# Patient Record
Sex: Female | Born: 1990 | Race: Black or African American | Hispanic: No | Marital: Single | Smoking: Never smoker
Health system: Southern US, Community
[De-identification: ages and names within clinical notes are randomized; demographics above are authoritative.]

## PROBLEM LIST (undated history)

## (undated) DIAGNOSIS — J069 Acute upper respiratory infection, unspecified: Secondary | ICD-10-CM

## (undated) DIAGNOSIS — L309 Dermatitis, unspecified: Secondary | ICD-10-CM

## (undated) DIAGNOSIS — R87619 Unspecified abnormal cytological findings in specimens from cervix uteri: Secondary | ICD-10-CM

## (undated) DIAGNOSIS — F32A Depression, unspecified: Secondary | ICD-10-CM

## (undated) DIAGNOSIS — B009 Herpesviral infection, unspecified: Secondary | ICD-10-CM

## (undated) DIAGNOSIS — D219 Benign neoplasm of connective and other soft tissue, unspecified: Secondary | ICD-10-CM

## (undated) DIAGNOSIS — J40 Bronchitis, not specified as acute or chronic: Secondary | ICD-10-CM

## (undated) DIAGNOSIS — R7989 Other specified abnormal findings of blood chemistry: Secondary | ICD-10-CM

## (undated) DIAGNOSIS — I82813 Embolism and thrombosis of superficial veins of lower extremities, bilateral: Secondary | ICD-10-CM

## (undated) DIAGNOSIS — Z8619 Personal history of other infectious and parasitic diseases: Secondary | ICD-10-CM

## (undated) DIAGNOSIS — J45909 Unspecified asthma, uncomplicated: Secondary | ICD-10-CM

## (undated) DIAGNOSIS — F329 Major depressive disorder, single episode, unspecified: Secondary | ICD-10-CM

## (undated) DIAGNOSIS — F419 Anxiety disorder, unspecified: Secondary | ICD-10-CM

## (undated) DIAGNOSIS — N390 Urinary tract infection, site not specified: Secondary | ICD-10-CM

## (undated) HISTORY — DX: Dermatitis, unspecified: L30.9

## (undated) HISTORY — DX: Unspecified asthma, uncomplicated: J45.909

## (undated) HISTORY — DX: Other specified abnormal findings of blood chemistry: R79.89

## (undated) HISTORY — DX: Herpesviral infection, unspecified: B00.9

## (undated) HISTORY — DX: Benign neoplasm of connective and other soft tissue, unspecified: D21.9

## (undated) HISTORY — DX: Acute upper respiratory infection, unspecified: J06.9

## (undated) HISTORY — DX: Depression, unspecified: F32.A

## (undated) HISTORY — DX: Embolism and thrombosis of superficial veins of lower extremities, bilateral: I82.813

## (undated) HISTORY — DX: Unspecified abnormal cytological findings in specimens from cervix uteri: R87.619

## (undated) HISTORY — DX: Major depressive disorder, single episode, unspecified: F32.9

## (undated) HISTORY — DX: Personal history of other infectious and parasitic diseases: Z86.19

## (undated) HISTORY — DX: Anxiety disorder, unspecified: F41.9

---
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
## 2009-04-04 ENCOUNTER — Emergency Department (HOSPITAL_COMMUNITY): Admission: EM | Admit: 2009-04-04 | Discharge: 2009-04-04 | Payer: Self-pay | Admitting: Emergency Medicine

## 2009-11-28 ENCOUNTER — Emergency Department (HOSPITAL_COMMUNITY): Admission: EM | Admit: 2009-11-28 | Discharge: 2009-11-28 | Payer: Self-pay | Admitting: Emergency Medicine

## 2010-01-14 ENCOUNTER — Encounter: Admission: RE | Admit: 2010-01-14 | Discharge: 2010-01-14 | Payer: Self-pay | Admitting: Family Medicine

## 2010-08-28 LAB — URINALYSIS, ROUTINE W REFLEX MICROSCOPIC
Nitrite: NEGATIVE
Protein, ur: NEGATIVE mg/dL
Urobilinogen, UA: 1 mg/dL (ref 0.0–1.0)
pH: 7 (ref 5.0–8.0)

## 2010-08-28 LAB — COMPREHENSIVE METABOLIC PANEL
AST: 17 U/L (ref 0–37)
CO2: 26 mEq/L (ref 19–32)
Calcium: 8.9 mg/dL (ref 8.4–10.5)
GFR calc Af Amer: >60 mL/min (ref >60)
GFR calc non Af Amer: >60 mL/min (ref >60)
Glucose, Bld: 87 mg/dL (ref 70–99)
Potassium: 3.7 mEq/L (ref 3.5–5.1)
Sodium: 138 mEq/L (ref 135–145)
Total Bilirubin: 0.6 mg/dL (ref 0.3–1.2)

## 2010-08-28 LAB — CBC
Hemoglobin: 13.3 g/dL (ref 12.0–15.0)
MCHC: 34.3 g/dL (ref 30.0–36.0)
RDW: 15.2 % (ref 11.5–15.5)

## 2010-08-28 LAB — DIFFERENTIAL
Basophils Absolute: 0 10*3/uL (ref 0.0–0.1)
Eosinophils Absolute: 0 10*3/uL (ref 0.0–0.7)
Eosinophils Relative: 0 % (ref 0–5)
Lymphocytes Relative: 32 % (ref 12–46)
Lymphs Abs: 1.9 10*3/uL (ref 0.7–4.0)
Neutrophils Relative %: 60 % (ref 43–77)

## 2010-08-28 LAB — LIPASE, BLOOD: Lipase: 30 U/L (ref 11–59)

## 2010-09-15 LAB — URINE MICROSCOPIC-ADD ON

## 2010-09-15 LAB — URINALYSIS, ROUTINE W REFLEX MICROSCOPIC
Protein, ur: NEGATIVE mg/dL
Specific Gravity, Urine: 1.02 (ref 1.005–1.030)
Urobilinogen, UA: 1 mg/dL (ref 0.0–1.0)

## 2010-09-15 LAB — POCT I-STAT, CHEM 8
Chloride: 103 mEq/L (ref 96–112)
Creatinine, Ser: 0.9 mg/dL (ref 0.4–1.2)
Hemoglobin: 13.3 g/dL (ref 12.0–15.0)
TCO2: 25 mmol/L (ref 0–100)

## 2010-09-15 LAB — PREGNANCY, URINE: Preg Test, Ur: NEGATIVE

## 2012-07-13 DIAGNOSIS — Z8619 Personal history of other infectious and parasitic diseases: Secondary | ICD-10-CM

## 2012-07-13 HISTORY — DX: Personal history of other infectious and parasitic diseases: Z86.19

## 2012-09-07 ENCOUNTER — Encounter (HOSPITAL_COMMUNITY): Payer: Self-pay | Admitting: Emergency Medicine

## 2012-09-07 ENCOUNTER — Emergency Department (HOSPITAL_COMMUNITY)
Admission: EM | Admit: 2012-09-07 | Discharge: 2012-09-07 | Disposition: A | Discharge status: Home / Self Care | Payer: Self-pay | Attending: Emergency Medicine | Admitting: Emergency Medicine

## 2012-09-07 DIAGNOSIS — I8390 Asymptomatic varicose veins of unspecified lower extremity: Secondary | ICD-10-CM | POA: Insufficient documentation

## 2012-09-07 DIAGNOSIS — M79605 Pain in left leg: Secondary | ICD-10-CM

## 2012-09-07 DIAGNOSIS — M79604 Pain in right leg: Secondary | ICD-10-CM

## 2012-09-07 DIAGNOSIS — M79609 Pain in unspecified limb: Secondary | ICD-10-CM | POA: Insufficient documentation

## 2012-09-07 DIAGNOSIS — M722 Plantar fascial fibromatosis: Secondary | ICD-10-CM | POA: Insufficient documentation

## 2012-09-07 DIAGNOSIS — M7989 Other specified soft tissue disorders: Secondary | ICD-10-CM | POA: Insufficient documentation

## 2012-09-07 MED ORDER — TRAMADOL HCL 50 MG PO TABS
50.0000 mg | ORAL_TABLET | Freq: Once | ORAL | Status: AC
  Administered 2012-09-07: 50 mg via ORAL
  Filled 2012-09-07: qty 1

## 2012-09-07 MED ORDER — DICLOFENAC SODIUM 75 MG PO TBEC: 75.0000 mg | DELAYED_RELEASE_TABLET | Freq: Two times a day (BID) | ORAL | Status: DC

## 2012-09-07 NOTE — ED Notes (Signed)
Pt complains of "both legs hurting me for a year and a half" Pt reports that the pain is worse when standing.

## 2012-09-07 NOTE — ED Provider Notes (Signed)
History    This chart was scribed for non-physician practitioner working with Rolan Bucco, MD by Smitty Pluck, ED scribe. This patient was seen in room WTR9/WTR9 and the patient's care was started at 4:45 PM.   CSN: [id number]  Arrival date & time 09/07/12  1626     Chief Complaint  Patient presents with  . Leg Pain    The history is provided by the patient. No language interpreter was used.   Shelby Jackson is a 22 y.o. female who presents to the Emergency Department complaining of intermittent, bilateral leg pain onset 1.5 years ago that worsened yesterday. She describes the pain as aching and tightness. She states that when she stands she has bilateral swelling and pain in legs. She reports that she normally stands at work for long periods of time. She mentions that the pain normally subsides after resting but the pain has remained constant since yesterday. Movement of feet aggravates the pain. She states that she purchased new Dr Jabier Mutton shoes without relief of pain. Denies taking medication PTA.  Pt denies injury to legs or feet, fall, fever, chills, nausea, vomiting, diarrhea, weakness, cough, SOB and any other pain.    History reviewed. No pertinent past medical history.  History reviewed. No pertinent past surgical history.  No family history on file.  History  Substance Use Topics  . Smoking status: Never Smoker   . Smokeless tobacco: Not on file  . Alcohol Use: No    OB History   Grav Para Term Preterm Abortions TAB SAB Ect Mult Living                  Review of Systems  Constitutional: Negative for fever and chills.  Respiratory: Negative for shortness of breath.   Gastrointestinal: Negative for nausea and vomiting.  Musculoskeletal: Positive for arthralgias.  Neurological: Negative for weakness.  All other systems reviewed and are negative.    Allergies  Latex  Home Medications   Current Outpatient Rx  Name  Route  Sig  Dispense  Refill  .  norethindrone-ethinyl estradiol (TRIPHASIL,CYCLAFEM,ALYACEN) 0.5/0.75/1-35 MG-MCG tablet   Oral   Take 1 tablet by mouth daily.           BP 128/71  Pulse 79  Temp(Src) 98.5 F (36.9 C) (Oral)  SpO2 100%  LMP 08/25/2012  Physical Exam  Nursing note and vitals reviewed. Constitutional: She is oriented to person, place, and time. She appears well-developed and well-nourished. No distress.  HENT:  Head: Normocephalic and atraumatic.  Eyes: EOM are normal.  Neck: Neck supple. No tracheal deviation present.  Cardiovascular: Normal rate.   Pulmonary/Chest: Effort normal. No respiratory distress.  Musculoskeletal: Normal range of motion.       Right lower leg: Normal.       Left lower leg: Normal.       Left foot: She exhibits tenderness (over the planatar fascia). She exhibits normal range of motion, no bony tenderness, no swelling, normal capillary refill, no crepitus, no deformity and no laceration.  Neurological: She is alert and oriented to person, place, and time.  Skin: Skin is warm and dry.  Psychiatric: She has a normal mood and affect. Her behavior is normal.    ED Course  Procedures (including critical care time) DIAGNOSTIC STUDIES: Oxygen Saturation is 100% on room air, normal by my interpretation.    COORDINATION OF CARE: 5:00 PM Discussed ED treatment with pt and pt agrees to pain medication. Pt instructed to wear compression stockings.  Pt will get referral to orthopedist.     Labs Reviewed - No data to display No results found.   1. Varicose veins   2. Plantar fasciitis of left foot   3. Leg pain, bilateral       MDM  Pains have been for 1.5 years. Legs ache after standing up for 6 hours despite new shoes. Will give Ortho referral.  Pt has been advised of the symptoms that warrant their return to the ED. Patient has voiced understanding and has agreed to follow-up with the PCP or specialist.  I personally performed the services described in this  documentation, which was scribed in my presence. The recorded information has been reviewed and is accurate.   Dorthula Matas, PA-C 09/07/12 1707

## 2012-09-07 NOTE — ED Provider Notes (Signed)
Medical screening examination/treatment/procedure(s) were performed by non-physician practitioner and as supervising physician I was immediately available for consultation/collaboration.   Enes Wegener, MD 09/07/12 2241 

## 2012-10-25 ENCOUNTER — Encounter (HOSPITAL_COMMUNITY): Payer: Self-pay | Admitting: Emergency Medicine

## 2012-10-25 ENCOUNTER — Emergency Department (HOSPITAL_COMMUNITY)
Admission: EM | Admit: 2012-10-25 | Discharge: 2012-10-25 | Disposition: A | Discharge status: Home / Self Care | Payer: BC Managed Care – PPO | Attending: Emergency Medicine | Admitting: Emergency Medicine

## 2012-10-25 ENCOUNTER — Emergency Department (HOSPITAL_COMMUNITY): Payer: BC Managed Care – PPO

## 2012-10-25 DIAGNOSIS — R109 Unspecified abdominal pain: Secondary | ICD-10-CM | POA: Insufficient documentation

## 2012-10-25 DIAGNOSIS — Z87440 Personal history of urinary (tract) infections: Secondary | ICD-10-CM | POA: Insufficient documentation

## 2012-10-25 DIAGNOSIS — R319 Hematuria, unspecified: Secondary | ICD-10-CM | POA: Insufficient documentation

## 2012-10-25 DIAGNOSIS — R3915 Urgency of urination: Secondary | ICD-10-CM | POA: Insufficient documentation

## 2012-10-25 DIAGNOSIS — N3090 Cystitis, unspecified without hematuria: Secondary | ICD-10-CM | POA: Diagnosis present

## 2012-10-25 DIAGNOSIS — M549 Dorsalgia, unspecified: Secondary | ICD-10-CM | POA: Insufficient documentation

## 2012-10-25 DIAGNOSIS — N949 Unspecified condition associated with female genital organs and menstrual cycle: Secondary | ICD-10-CM | POA: Insufficient documentation

## 2012-10-25 DIAGNOSIS — N390 Urinary tract infection, site not specified: Secondary | ICD-10-CM

## 2012-10-25 DIAGNOSIS — R300 Dysuria: Secondary | ICD-10-CM | POA: Insufficient documentation

## 2012-10-25 DIAGNOSIS — Z3202 Encounter for pregnancy test, result negative: Secondary | ICD-10-CM | POA: Insufficient documentation

## 2012-10-25 DIAGNOSIS — Z91040 Latex allergy status: Secondary | ICD-10-CM | POA: Insufficient documentation

## 2012-10-25 HISTORY — DX: Urinary tract infection, site not specified: N39.0

## 2012-10-25 LAB — URINALYSIS, ROUTINE W REFLEX MICROSCOPIC
Glucose, UA: NEGATIVE mg/dL
Specific Gravity, Urine: 1.027 (ref 1.005–1.030)
Urobilinogen, UA: 1 mg/dL (ref 0.0–1.0)

## 2012-10-25 LAB — CBC WITH DIFFERENTIAL/PLATELET
Basophils Relative: 0 % (ref 0–1)
Eosinophils Absolute: 0 10*3/uL (ref 0.0–0.7)
Eosinophils Relative: 0 % (ref 0–5)
Hemoglobin: 14 g/dL (ref 12.0–15.0)
Lymphs Abs: 1.3 10*3/uL (ref 0.7–4.0)
MCH: 32.4 pg (ref 26.0–34.0)
MCHC: 35.9 g/dL (ref 30.0–36.0)
MCV: 90.3 fL (ref 78.0–100.0)
Monocytes Relative: 7 % (ref 3–12)
Platelets: 132 10*3/uL — ABNORMAL LOW (ref 150–400)
RBC: 4.32 MIL/uL (ref 3.87–5.11)

## 2012-10-25 LAB — URINE MICROSCOPIC-ADD ON

## 2012-10-25 LAB — BASIC METABOLIC PANEL
BUN: 10 mg/dL (ref 6–23)
CO2: 22 mEq/L (ref 19–32)
Calcium: 8.8 mg/dL (ref 8.4–10.5)
Creatinine, Ser: 1.1 mg/dL (ref 0.50–1.10)
GFR calc Af Amer: 82 mL/min — ABNORMAL LOW (ref >90)

## 2012-10-25 LAB — PREGNANCY, URINE: Preg Test, Ur: NEGATIVE

## 2012-10-25 LAB — POCT PREGNANCY, URINE: Preg Test, Ur: NEGATIVE

## 2012-10-25 MED ORDER — TRAMADOL HCL 50 MG PO TABS
50.0000 mg | ORAL_TABLET | Freq: Once | ORAL | Status: AC
  Administered 2012-10-25: 50 mg via ORAL
  Filled 2012-10-25: qty 1

## 2012-10-25 MED ORDER — MORPHINE SULFATE 4 MG/ML IJ SOLN
4.0000 mg | Freq: Once | INTRAMUSCULAR | Status: AC
  Administered 2012-10-25: 4 mg via INTRAVENOUS
  Filled 2012-10-25: qty 1

## 2012-10-25 MED ORDER — CEPHALEXIN 500 MG PO CAPS: 500.0000 mg | ORAL_CAPSULE | Freq: Three times a day (TID) | ORAL | Status: DC

## 2012-10-25 MED ORDER — ONDANSETRON 8 MG PO TBDP
8.0000 mg | ORAL_TABLET | Freq: Once | ORAL | Status: AC
  Administered 2012-10-25: 8 mg via ORAL
  Filled 2012-10-25: qty 1

## 2012-10-25 MED ORDER — HYDROCODONE-ACETAMINOPHEN 5-325 MG PO TABS
1.0000 | ORAL_TABLET | Freq: Once | ORAL | Status: AC
  Administered 2012-10-25: 1 via ORAL
  Filled 2012-10-25: qty 1

## 2012-10-25 MED ORDER — LIDOCAINE HCL 1 % IJ SOLN
INTRAMUSCULAR | Status: AC
  Administered 2012-10-25: 20 mL
  Filled 2012-10-25: qty 20

## 2012-10-25 MED ORDER — SODIUM CHLORIDE 0.9 % IV SOLN
Freq: Once | INTRAVENOUS | Status: AC
  Administered 2012-10-25: 22:00:00 via INTRAVENOUS

## 2012-10-25 MED ORDER — PROMETHAZINE HCL 25 MG PO TABS: 25.0000 mg | ORAL_TABLET | Freq: Four times a day (QID) | ORAL | Status: DC | PRN

## 2012-10-25 MED ORDER — HYDROCODONE-ACETAMINOPHEN 5-325 MG PO TABS: 1.0000 | ORAL_TABLET | ORAL | Status: DC | PRN

## 2012-10-25 MED ORDER — CEFTRIAXONE SODIUM 1 G IJ SOLR
1.0000 g | Freq: Once | INTRAMUSCULAR | Status: AC
  Administered 2012-10-25: 1 g via INTRAMUSCULAR
  Filled 2012-10-25: qty 10

## 2012-10-25 MED ORDER — PROMETHAZINE HCL 25 MG/ML IJ SOLN
12.5000 mg | Freq: Once | INTRAMUSCULAR | Status: AC
  Administered 2012-10-25: 12.5 mg via INTRAVENOUS
  Filled 2012-10-25: qty 1

## 2012-10-25 NOTE — ED Notes (Signed)
Pt states that she has pain with urination and burning when voiding for the past few days now, has been taking azo for this with minimal relief and now has rt side abd pain.

## 2012-10-25 NOTE — ED Notes (Signed)
Patient states that pain medication did not help.

## 2012-10-25 NOTE — ED Notes (Signed)
PA at the bedside.

## 2012-10-25 NOTE — ED Notes (Signed)
Patient ambulated to bathroom gait steady

## 2012-10-25 NOTE — ED Provider Notes (Signed)
Medical screening examination/treatment/procedure(s) were performed by non-physician practitioner and as supervising physician I was immediately available for consultation/collaboration.  Geoffery Lyons, MD 10/25/12 2322

## 2012-10-25 NOTE — ED Provider Notes (Signed)
History    CSN: [id number] Arrival date & time 10/25/12  1126  First MD Initiated Contact with Patient 10/25/12 1137     Chief Complaint  Patient presents with  . Urinary Frequency   HPI Comments: 22 year old female with a two-day history of urinary frequency and dysuria.  Presenting with worsening right lower quadrant and suprapubic pain radiating to her right lower flank.  She has been treating herself with his over-the-counter AZO for presumed urinary tract infection. She denies any nausea, vomiting (non today, 1 episode of regurgitation 3 days ago prior to onset of symptoms), fevers, chills.  Reports having normal bowel movements.  LMP 2 weeks ago and was normal.  On OCPs takes daily.  Unprotected intercourse with 1 partner. No hx of STI.  Checked for STI 2 weeks ago and was negative.  No hx of Kidney Stones.    The history is provided by the patient.    Past Medical History  Diagnosis Date  . UTI (lower urinary tract infection)     No past surgical history on file.  No family history on file.  History  Substance Use Topics  . Smoking status: Never Smoker   . Smokeless tobacco: Not on file  . Alcohol Use: No    OB History   Grav Para Term Preterm Abortions TAB SAB Ect Mult Living                  Review of Systems  Constitutional: Negative for chills, diaphoresis, activity change, appetite change and fatigue.  HENT: Negative for congestion, facial swelling, neck pain, neck stiffness and voice change.   Eyes: Negative for discharge.  Respiratory: Negative for choking, chest tightness, shortness of breath and wheezing.   Cardiovascular: Negative for chest pain and palpitations.  Gastrointestinal: Positive for abdominal pain (suprapubic radiating to R sided pain but not to CVA). Negative for nausea, vomiting, diarrhea, constipation, blood in stool and abdominal distention.  Endocrine: Negative.   Genitourinary: Positive for dysuria, urgency, frequency, hematuria (unsure  given on AZO and urine is red/pink), flank pain, decreased urine volume, difficulty urinating and pelvic pain. Negative for vaginal bleeding, vaginal discharge, vaginal pain, menstrual problem and dyspareunia.  Musculoskeletal: Positive for back pain (r sided flank pain not at level of CVA).  Skin: Negative.   Allergic/Immunologic: Negative.   Neurological: Negative for dizziness, syncope and headaches.  Hematological: Negative.   Psychiatric/Behavioral: Negative.     Allergies  Caffeine and Latex  Home Medications   Current Outpatient Rx  Name  Route  Sig  Dispense  Refill  . diclofenac (VOLTAREN) 75 MG EC tablet   Oral   Take 1 tablet (75 mg total) by mouth 2 (two) times daily.   20 tablet   0   . norethindrone-ethinyl estradiol (TRIPHASIL,CYCLAFEM,ALYACEN) 0.5/0.75/1-35 MG-MCG tablet   Oral   Take 1 tablet by mouth daily.         . cephALEXin (KEFLEX) 500 MG capsule   Oral   Take 1 capsule (500 mg total) by mouth 3 (three) times daily.   21 capsule   0     BP 130/86  Pulse 69  Temp(Src) 98.3 F (36.8 C) (Oral)  Resp 16  SpO2 98%  LMP 10/11/2012  Physical Exam  Nursing note and vitals reviewed. Constitutional: She appears well-developed and well-nourished. No distress.  HENT:  Head: Normocephalic and atraumatic.  Eyes: Conjunctivae are normal. Right eye exhibits no discharge. Left eye exhibits no discharge. No scleral icterus.  Neck:  No JVD present. No tracheal deviation present.  Cardiovascular: Normal rate, regular rhythm, normal heart sounds and intact distal pulses.  Exam reveals no gallop and no friction rub.   No murmur heard. Pulmonary/Chest: Effort normal and breath sounds normal. No respiratory distress. She has no wheezes. She has no rales.  Abdominal: Soft. She exhibits distension. She exhibits no mass. There is tenderness (diffusely/nonfocal RLQ/suprapubic, not over mcburney's, negative murphy's). There is rebound and guarding.  Musculoskeletal:  Normal range of motion. She exhibits no edema.       Arms: Neurological: She is alert. She exhibits normal muscle tone.  Skin: Skin is warm and dry. No rash noted. She is not diaphoretic. No erythema. No pallor.  Psychiatric: She has a normal mood and affect. Her behavior is normal. Judgment and thought content normal.    ED Course  Procedures (including critical care time)  Labs Reviewed  URINALYSIS, ROUTINE W REFLEX MICROSCOPIC - Abnormal; Notable for the following:    Color, Urine ORANGE (*)    APPearance HAZY (*)    Hgb urine dipstick LARGE (*)    Bilirubin Urine SMALL (*)    Protein, ur 30 (*)    Nitrite POSITIVE (*)    Leukocytes, UA SMALL (*)    All other components within normal limits  BASIC METABOLIC PANEL - Abnormal; Notable for the following:    GFR calc non Af Amer 71 (*)    GFR calc Af Amer 82 (*)    All other components within normal limits  URINE MICROSCOPIC-ADD ON - Abnormal; Notable for the following:    Squamous Epithelial / LPF FEW (*)    Bacteria, UA FEW (*)    All other components within normal limits  URINE CULTURE  PREGNANCY, URINE  POCT PREGNANCY, URINE   US Renal  10/25/2012   *RADIOLOGY REPORT*  Clinical Data: Urinary frequency.  Evaluate for hydronephrosis.  RENAL/URINARY TRACT ULTRASOUND COMPLETE  Comparison:  Plain films same date.  Ultrasound 11/28/2009  Findings:  Right Kidney:  10.5 cm.  Minimal pelvicaliectasis, without overt hydronephrosis.  Left Kidney:  9.2 cm. No hydronephrosis.  Bladder:  Partially collapsed.  Ureteric jets not identified.  IMPRESSION: Minimal right-sided pelvicaliectasis, without overt hydronephrosis.   Original Report Authenticated By: Jeronimo Greaves, M.D.   Dg Abd Acute W/chest  10/25/2012   *RADIOLOGY REPORT*  Clinical Data: Abdominal pain  ACUTE ABDOMEN SERIES (ABDOMEN 2 VIEW & CHEST 1 VIEW)  Comparison:  04/04/2009 chest x-ray  Findings:  There is no evidence of dilated bowel loops or free intraperitoneal air.  No  radiopaque calculi or other significant radiographic abnormality is seen. Heart size and mediastinal contours are within normal limits.  Both lungs are clear.  IMPRESSION: Negative abdominal radiographs.  No acute cardiopulmonary disease.   Original Report Authenticated By: Judie Petit. Shick, M.D.     1. Cystitis       MDM  2 day history of urinary symptoms.  No systemic s/sx but given pain radiating to R side will obtain acute abdominal series & UA  UA with UTI.  Given flank pain will order [place] to eval for hydro and consider infected stone vs pyleo?  Tx with Rocephin IM X 1 and plan for Keflex as OP.    Will need OP follow up and return precautions reviewed.  Return precautions given regarding potential occult appendicitis vs pyelonephrosis.  Andrena Mews, DO 10/25/12 1510

## 2012-10-25 NOTE — ED Notes (Signed)
Pt c/o pain with urination onset today, seen earlier for same, received inj of antbx and script. Pt states pain now worse with +n/v

## 2012-10-25 NOTE — ED Provider Notes (Signed)
History     CSN: [id number]  Arrival date & time 10/25/12  1906   First MD Initiated Contact with Patient 10/25/12 1919      Chief Complaint  Patient presents with  . Dysuria    (Consider location/radiation/quality/duration/timing/severity/associated sxs/prior treatment) Patient is a 22 y.o. female presenting with dysuria. The history is provided by the patient. No language interpreter was used.  Dysuria  This is a new problem. Associated symptoms include nausea and flank pain. Associated symptoms comments: Seen earlier today and diagnosed with UTI. She returns with increased right flank pain and nausea. Symptoms started today with dysuria. No other symptoms. No fever. .    Past Medical History  Diagnosis Date  . UTI (lower urinary tract infection)     History reviewed. No pertinent past surgical history.  No family history on file.  History  Substance Use Topics  . Smoking status: Never Smoker   . Smokeless tobacco: Not on file  . Alcohol Use: No    OB History   Grav Para Term Preterm Abortions TAB SAB Ect Mult Living                  Review of Systems  Constitutional: Negative for fever.  Respiratory: Negative for shortness of breath.   Gastrointestinal: Positive for nausea.  Genitourinary: Positive for dysuria and flank pain.    Allergies  Caffeine and Latex  Home Medications   Current Outpatient Rx  Name  Route  Sig  Dispense  Refill  . cephALEXin (KEFLEX) 500 MG capsule   Oral   Take 1 capsule (500 mg total) by mouth 3 (three) times daily.   21 capsule   0   . diclofenac (VOLTAREN) 75 MG EC tablet   Oral   Take 1 tablet (75 mg total) by mouth 2 (two) times daily.   20 tablet   0   . norethindrone-ethinyl estradiol (TRIPHASIL,CYCLAFEM,ALYACEN) 0.5/0.75/1-35 MG-MCG tablet   Oral   Take 1 tablet by mouth daily.           BP 143/98  Pulse 80  Temp(Src) 98.3 F (36.8 C) (Oral)  Resp 18  Ht 5\' 1"  (1.549 m)  Wt 185 lb (83.915 kg)  BMI  34.97 kg/m2  SpO2 98%  LMP 10/11/2012  Physical Exam  Constitutional: She is oriented to person, place, and time. She appears well-developed and well-nourished.  HENT:  Head: Normocephalic.  Neck: Normal range of motion. Neck supple.  Cardiovascular: Normal rate and regular rhythm.   Pulmonary/Chest: Effort normal and breath sounds normal.  Abdominal: Soft. Bowel sounds are normal. There is no rebound and no guarding.  Right sided abdominal tenderness without rebound or guarding. No RLQ tenderness.   Musculoskeletal: Normal range of motion.  Neurological: She is alert and oriented to person, place, and time.  Skin: Skin is warm and dry. No rash noted.  Psychiatric: She has a normal mood and affect.    ED Course  Procedures (including critical care time)  Labs Reviewed  CBC WITH DIFFERENTIAL - Abnormal; Notable for the following:    Platelets 132 (*)    Neutrophils Relative % 78 (*)    All other components within normal limits   US Renal  10/25/2012   *RADIOLOGY REPORT*  Clinical Data: Urinary frequency.  Evaluate for hydronephrosis.  RENAL/URINARY TRACT ULTRASOUND COMPLETE  Comparison:  Plain films same date.  Ultrasound 11/28/2009  Findings:  Right Kidney:  10.5 cm.  Minimal pelvicaliectasis, without overt hydronephrosis.  Left Kidney:  9.2 cm. No hydronephrosis.  Bladder:  Partially collapsed.  Ureteric jets not identified.  IMPRESSION: Minimal right-sided pelvicaliectasis, without overt hydronephrosis.   Original Report Authenticated By: Jeronimo Greaves, M.D.   Dg Abd Acute W/chest  10/25/2012   *RADIOLOGY REPORT*  Clinical Data: Abdominal pain  ACUTE ABDOMEN SERIES (ABDOMEN 2 VIEW & CHEST 1 VIEW)  Comparison:  04/04/2009 chest x-ray  Findings:  There is no evidence of dilated bowel loops or free intraperitoneal air.  No radiopaque calculi or other significant radiographic abnormality is seen. Heart size and mediastinal contours are within normal limits.  Both lungs are clear.   IMPRESSION: Negative abdominal radiographs.  No acute cardiopulmonary disease.   Original Report Authenticated By: Judie Petit. Miles Costain, M.D.     No diagnosis found. 1. UTI    MDM  Pain and nausea controlled. Patient feeling much better. CBC checked (was not checked earlier) and is normal. Stable for discharge.         Arnoldo Hooker, PA-C 10/25/12 2308

## 2012-10-25 NOTE — ED Notes (Signed)
Went to beside and found patient laying over trash can. Patient states that she is very nauseated. Medicated patient for nausea. Informed patient that we would allow time to allow medication to relieve nausea before administering pain medication and to notify nurse if she began vomiting.

## 2012-10-26 LAB — URINE CULTURE: Colony Count: 75000

## 2012-10-29 NOTE — ED Provider Notes (Signed)
I saw and evaluated the patient, reviewed the resident's note and I agree with the findings and plan.  Symptoms controlled. Uti. No signs of hydro or complicating infection  Lyanne Co, MD 10/29/12 [phone]

## 2012-12-06 ENCOUNTER — Emergency Department (HOSPITAL_COMMUNITY)
Admission: EM | Admit: 2012-12-06 | Discharge: 2012-12-06 | Disposition: A | Discharge status: Home / Self Care | Payer: BC Managed Care – PPO | Attending: Emergency Medicine | Admitting: Emergency Medicine

## 2012-12-06 ENCOUNTER — Encounter (HOSPITAL_COMMUNITY): Payer: Self-pay | Admitting: Emergency Medicine

## 2012-12-06 DIAGNOSIS — Z711 Person with feared health complaint in whom no diagnosis is made: Secondary | ICD-10-CM

## 2012-12-06 DIAGNOSIS — R300 Dysuria: Secondary | ICD-10-CM | POA: Insufficient documentation

## 2012-12-06 DIAGNOSIS — Z3202 Encounter for pregnancy test, result negative: Secondary | ICD-10-CM | POA: Insufficient documentation

## 2012-12-06 DIAGNOSIS — N898 Other specified noninflammatory disorders of vagina: Secondary | ICD-10-CM | POA: Insufficient documentation

## 2012-12-06 DIAGNOSIS — Z113 Encounter for screening for infections with a predominantly sexual mode of transmission: Secondary | ICD-10-CM | POA: Insufficient documentation

## 2012-12-06 DIAGNOSIS — Z87440 Personal history of urinary (tract) infections: Secondary | ICD-10-CM | POA: Insufficient documentation

## 2012-12-06 DIAGNOSIS — R319 Hematuria, unspecified: Secondary | ICD-10-CM

## 2012-12-06 DIAGNOSIS — R109 Unspecified abdominal pain: Secondary | ICD-10-CM

## 2012-12-06 LAB — WET PREP, GENITAL
Trich, Wet Prep: NONE SEEN
Yeast Wet Prep HPF POC: NONE SEEN

## 2012-12-06 LAB — URINALYSIS, ROUTINE W REFLEX MICROSCOPIC
Bilirubin Urine: NEGATIVE
Glucose, UA: NEGATIVE mg/dL
Ketones, ur: NEGATIVE mg/dL
Nitrite: NEGATIVE
Protein, ur: NEGATIVE mg/dL
Specific Gravity, Urine: 1.015 (ref 1.005–1.030)
Urobilinogen, UA: 0.2 mg/dL (ref 0.0–1.0)
pH: 6 (ref 5.0–8.0)

## 2012-12-06 LAB — URINE MICROSCOPIC-ADD ON

## 2012-12-06 LAB — PREGNANCY, URINE: Preg Test, Ur: NEGATIVE

## 2012-12-06 MED ORDER — AZITHROMYCIN 250 MG PO TABS
1000.0000 mg | ORAL_TABLET | Freq: Once | ORAL | Status: AC
  Administered 2012-12-06: 1000 mg via ORAL
  Filled 2012-12-06: qty 4

## 2012-12-06 MED ORDER — CEFTRIAXONE SODIUM 250 MG IJ SOLR
250.0000 mg | Freq: Once | INTRAMUSCULAR | Status: AC
  Administered 2012-12-06: 250 mg via INTRAMUSCULAR
  Filled 2012-12-06: qty 250

## 2012-12-06 NOTE — Progress Notes (Signed)
Pt confirms pcp is terry daniels [facility] in [place]  EPIC updated

## 2012-12-06 NOTE — ED Notes (Signed)
PA at bedside with nurse aide to perform pelvic exam.

## 2012-12-06 NOTE — ED Notes (Addendum)
Patient reports that a couple days ago her lower medial abdomen began hurting along with dysuria and urinary frequency. Patient denies blood in urine, denies N/V/D. Patient reports worsening of symptoms today along with some pain radiation around to the right side.

## 2012-12-06 NOTE — ED Provider Notes (Signed)
History    CSN: [id number] Arrival date & time 12/06/12  4098  First MD Initiated Contact with Patient 12/06/12 1008     Chief Complaint  Patient presents with  . Abdominal Pain   (Consider location/radiation/quality/duration/timing/severity/associated sxs/prior Treatment) HPI Pt is a 21yo female presenting with 4 day hx of dysuria and suprapubic and pelvic pain described as intermittent moderate cramping associated with urination.  Pt denies hematuria, fever, n/v/d.  Denies vaginal discharge, itching, or pain.  Does report recent dx of UTI that turned out to be chlamydia.  Pt states her sexual partner is the same however when he was tested, his test was negative.  Pt states she wishes to be tx today for possible recurrence of STI.   Past Medical History  Diagnosis Date  . UTI (lower urinary tract infection)    History reviewed. No pertinent past surgical history. History reviewed. No pertinent family history. History  Substance Use Topics  . Smoking status: Never Smoker   . Smokeless tobacco: Not on file  . Alcohol Use: No   OB History   Grav Para Term Preterm Abortions TAB SAB Ect Mult Living                 Review of Systems  Constitutional: Negative for fever and chills.  Gastrointestinal: Negative for nausea, vomiting and diarrhea.  Genitourinary: Positive for dysuria and pelvic pain. Negative for urgency, frequency, hematuria, flank pain, vaginal bleeding, vaginal discharge and vaginal pain.  All other systems reviewed and are negative.    Allergies  Albuterol; Caffeine; and Latex  Home Medications   Current Outpatient Rx  Name  Route  Sig  Dispense  Refill  . diclofenac (VOLTAREN) 75 MG EC tablet   Oral   Take 1 tablet (75 mg total) by mouth 2 (two) times daily.   20 tablet   0    BP 108/73  Pulse 76  Temp(Src) 98 F (36.7 C) (Oral)  Resp 16  Ht 5\' 1"  (1.549 m)  Wt 188 lb (85.276 kg)  BMI 35.54 kg/m2  SpO2 97%  LMP 11/29/2012 Physical Exam   Nursing note and vitals reviewed. Constitutional: She appears well-developed and well-nourished. No distress.  Pt resting comfortably in exam bed, NAD.  HENT:  Head: Normocephalic and atraumatic.  Eyes: Conjunctivae are normal. No scleral icterus.  Neck: Normal range of motion.  Cardiovascular: Normal rate, regular rhythm and normal heart sounds.   Pulmonary/Chest: Effort normal and breath sounds normal. No respiratory distress. She has no wheezes. She has no rales. She exhibits no tenderness.  Abdominal: Soft. Bowel sounds are normal. She exhibits no distension and no mass. There is tenderness ( mild, lower abdomen ). There is no rebound and no guarding.  Genitourinary: Uterus normal. No labial fusion. There is no rash, tenderness, lesion or injury on the right labia. There is no rash, tenderness, lesion or injury on the left labia. Uterus is not deviated, not enlarged, not fixed and not tender. Cervix exhibits motion tenderness and discharge ( clear). Cervix exhibits no friability. Right adnexum displays no mass, no tenderness and no fullness. Left adnexum displays no mass, no tenderness and no fullness. No erythema, tenderness or bleeding around the vagina. No foreign body around the vagina. No signs of injury around the vagina. Vaginal discharge ( clear) found.  Musculoskeletal: Normal range of motion.  Neurological: She is alert.  Skin: Skin is warm and dry. She is not diaphoretic.    ED Course  Procedures (including  critical care time) Labs Reviewed  WET PREP, GENITAL - Abnormal; Notable for the following:    Clue Cells Wet Prep HPF POC FEW (*)    WBC, Wet Prep HPF POC FEW (*)    All other components within normal limits  URINALYSIS, ROUTINE W REFLEX MICROSCOPIC - Abnormal; Notable for the following:    APPearance CLOUDY (*)    Hgb urine dipstick LARGE (*)    Leukocytes, UA TRACE (*)    All other components within normal limits  URINE MICROSCOPIC-ADD ON - Abnormal; Notable for the  following:    Bacteria, UA FEW (*)    All other components within normal limits  URINE CULTURE  GC/CHLAMYDIA PROBE AMP  PREGNANCY, URINE   No results found. 1. Abdominal pain   2. Concern about STD in female without diagnosis   3. Hematuria     MDM  Will check pt for UTI as well as STI.  Will tx pt as requested for recent chlamydia and possible reinfection.  Will also tx empirically for gonorrhea as these infections often occur together.  Will still send off for final testing.  Tx in ED: azithromycin and rocephin   UA: hematuria, pt could have small stone causing pain Pelvic: CMT with clear discharge Wet prep: unremarkable   Will discharge pt home and have her f/u with [facility] info provided. Return precautions given. Pt verbalized understanding and agreement with tx plan. Vitals: unremarkable. Discharged in stable condition.    Discussed pt with attending during ED encounter.           Junius Finner, PA-C 12/06/12 1207

## 2012-12-06 NOTE — Progress Notes (Signed)
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

## 2012-12-07 LAB — GC/CHLAMYDIA PROBE AMP
CT Probe RNA: NEGATIVE
GC Probe RNA: NEGATIVE

## 2012-12-07 LAB — URINE CULTURE: Colony Count: >=100000

## 2012-12-09 NOTE — ED Provider Notes (Signed)
Medical screening examination/treatment/procedure(s) were performed by non-physician practitioner and as supervising physician I was immediately available for consultation/collaboration.  Raeford Razor, MD 12/09/12 [phone]

## 2013-04-02 ENCOUNTER — Emergency Department (HOSPITAL_COMMUNITY)
Admission: EM | Admit: 2013-04-02 | Discharge: 2013-04-02 | Disposition: A | Discharge status: Home / Self Care | Payer: BC Managed Care – PPO | Attending: Emergency Medicine | Admitting: Emergency Medicine

## 2013-04-02 ENCOUNTER — Encounter (HOSPITAL_COMMUNITY): Payer: Self-pay | Admitting: Emergency Medicine

## 2013-04-02 DIAGNOSIS — R0602 Shortness of breath: Secondary | ICD-10-CM | POA: Insufficient documentation

## 2013-04-02 DIAGNOSIS — J069 Acute upper respiratory infection, unspecified: Secondary | ICD-10-CM | POA: Insufficient documentation

## 2013-04-02 DIAGNOSIS — Z87440 Personal history of urinary (tract) infections: Secondary | ICD-10-CM | POA: Insufficient documentation

## 2013-04-02 DIAGNOSIS — Z91040 Latex allergy status: Secondary | ICD-10-CM | POA: Insufficient documentation

## 2013-04-02 DIAGNOSIS — Z791 Long term (current) use of non-steroidal anti-inflammatories (NSAID): Secondary | ICD-10-CM | POA: Insufficient documentation

## 2013-04-02 DIAGNOSIS — R062 Wheezing: Secondary | ICD-10-CM | POA: Insufficient documentation

## 2013-04-02 LAB — RAPID STREP SCREEN (MED CTR MEBANE ONLY): Streptococcus, Group A Screen (Direct): NEGATIVE

## 2013-04-02 MED ORDER — GUAIFENESIN ER 1200 MG PO TB12: 1.0000 | ORAL_TABLET | Freq: Two times a day (BID) | ORAL | Status: DC

## 2013-04-02 MED ORDER — IBUPROFEN 800 MG PO TABS: 800.0000 mg | ORAL_TABLET | Freq: Three times a day (TID) | ORAL | Status: DC | PRN

## 2013-04-02 MED ORDER — ACETAMINOPHEN-CODEINE 120-12 MG/5ML PO SOLN: 10.0000 mL | ORAL | Status: DC | PRN

## 2013-04-02 NOTE — ED Provider Notes (Signed)
EKG Interpretation   None        Shelby Jackson [name], MD 04/02/13 2339 

## 2013-04-02 NOTE — ED Provider Notes (Signed)
CSN: [id number]     Arrival date & time 04/02/13  1532 History  This chart was scribed for non-physician practitioner Charlestine Night, PA-C, working with Celene Kras, MD by Dorothey Baseman, ED Scribe. This patient was seen in room WTR6/WTR6 and the patient's care was started at 5:12 PM.    Chief Complaint  Patient presents with  . Sore Throat   The history is provided by the patient. No language interpreter was used.   HPI Comments: Shelby Jackson is a 22 y.o. female who presents to the Emergency Department complaining of an itching sore throat onset 3 days ago that has been progressively worsening. She reports associated cough, headache, shortness of breath, mild wheezes at night, and rhinorrhea. She denies fever and emesis. Patient denies any pertinent medical history,   Past Medical History  Diagnosis Date  . UTI (lower urinary tract infection)    No past surgical history on file. No family history on file. History  Substance Use Topics  . Smoking status: Never Smoker   . Smokeless tobacco: Not on file  . Alcohol Use: No   OB History   Grav Para Term Preterm Abortions TAB SAB Ect Mult Living                 Review of Systems  A complete 10 system review of systems was obtained and all systems are negative except as noted in the HPI and PMH.   Allergies  Albuterol; Caffeine; and Latex  Home Medications   Current Outpatient Rx  Name  Route  Sig  Dispense  Refill  . diclofenac (VOLTAREN) 75 MG EC tablet   Oral   Take 1 tablet (75 mg total) by mouth 2 (two) times daily.   20 tablet   0    Triage Vitals: BP 124/75  Pulse 85  Temp(Src) 99.3 F (37.4 C) (Oral)  Resp 16  SpO2 100%  Physical Exam  Nursing note and vitals reviewed. Constitutional: She is oriented to person, place, and time. She appears well-developed and well-nourished. No distress.  HENT:  Head: Normocephalic and atraumatic.  Right Ear: Tympanic membrane, external ear and ear canal normal.   Left Ear: Tympanic membrane, external ear and ear canal normal.  Mouth/Throat: Oropharynx is clear and moist.  Tonsils mildly swollen.  Eyes: Conjunctivae are normal.  Neck: Normal range of motion. Neck supple.  Cardiovascular: Normal rate, regular rhythm and normal heart sounds.   Pulmonary/Chest: Effort normal and breath sounds normal. No respiratory distress.  Abdominal: She exhibits no distension.  Musculoskeletal: Normal range of motion.  Neurological: She is alert and oriented to person, place, and time.  Skin: Skin is warm and dry.  Psychiatric: She has a normal mood and affect. Her behavior is normal.    ED Course  Procedures (including critical care time)  DIAGNOSTIC STUDIES: Oxygen Saturation is 100% on room air, normal by my interpretation.    COORDINATION OF CARE: 5:14 PM- Will order a strep test. Discussed treatment plan with patient at bedside and patient verbalized agreement.     I personally performed the services described in this documentation, which was scribed in my presence. The recorded information has been reviewed and is accurate.   Carlyle Dolly, PA-C 04/02/13 1842

## 2013-04-02 NOTE — ED Notes (Signed)
Pt states for the past two days, she has had sore throat, headache, and body aches.

## 2013-04-05 LAB — CULTURE, GROUP A STREP

## 2014-12-30 ENCOUNTER — Emergency Department (HOSPITAL_COMMUNITY)
Admission: EM | Admit: 2014-12-30 | Discharge: 2014-12-30 | Disposition: A | Discharge status: Home / Self Care | Payer: BLUE CROSS/BLUE SHIELD | Attending: Emergency Medicine | Admitting: Emergency Medicine

## 2014-12-30 ENCOUNTER — Encounter (HOSPITAL_COMMUNITY): Payer: Self-pay | Admitting: *Deleted

## 2014-12-30 DIAGNOSIS — Z91040 Latex allergy status: Secondary | ICD-10-CM | POA: Insufficient documentation

## 2014-12-30 DIAGNOSIS — Z87440 Personal history of urinary (tract) infections: Secondary | ICD-10-CM | POA: Diagnosis not present

## 2014-12-30 DIAGNOSIS — R600 Localized edema: Secondary | ICD-10-CM

## 2014-12-30 DIAGNOSIS — M7661 Achilles tendinitis, right leg: Secondary | ICD-10-CM | POA: Diagnosis not present

## 2014-12-30 DIAGNOSIS — Z79899 Other long term (current) drug therapy: Secondary | ICD-10-CM | POA: Diagnosis not present

## 2014-12-30 DIAGNOSIS — Z3202 Encounter for pregnancy test, result negative: Secondary | ICD-10-CM | POA: Insufficient documentation

## 2014-12-30 DIAGNOSIS — R2241 Localized swelling, mass and lump, right lower limb: Secondary | ICD-10-CM | POA: Diagnosis present

## 2014-12-30 LAB — URINALYSIS, ROUTINE W REFLEX MICROSCOPIC
Bilirubin Urine: NEGATIVE
Glucose, UA: NEGATIVE mg/dL
Hgb urine dipstick: NEGATIVE
Ketones, ur: NEGATIVE mg/dL
Leukocytes, UA: NEGATIVE
Nitrite: NEGATIVE
Protein, ur: NEGATIVE mg/dL
Specific Gravity, Urine: 1.016 (ref 1.005–1.030)
Urobilinogen, UA: 1 mg/dL (ref 0.0–1.0)
pH: 6.5 (ref 5.0–8.0)

## 2014-12-30 LAB — I-STAT CHEM 8, ED
BUN: 17 mg/dL (ref 6–20)
Calcium, Ion: 1.24 mmol/L — ABNORMAL HIGH (ref 1.12–1.23)
Chloride: 103 mmol/L (ref 101–111)
Creatinine, Ser: 1.1 mg/dL — ABNORMAL HIGH (ref 0.44–1.00)
Glucose, Bld: 85 mg/dL (ref 65–99)
HCT: 40 % (ref 36.0–46.0)
Hemoglobin: 13.6 g/dL (ref 12.0–15.0)
Potassium: 4.5 mmol/L (ref 3.5–5.1)
Sodium: 138 mmol/L (ref 135–145)
TCO2: 28 mmol/L (ref 0–100)

## 2014-12-30 LAB — POC URINE PREG, ED: Preg Test, Ur: NEGATIVE

## 2014-12-30 MED ORDER — HYDROCODONE-ACETAMINOPHEN 5-325 MG PO TABS: 1.0000 | ORAL_TABLET | Freq: Four times a day (QID) | ORAL | Status: DC | PRN

## 2014-12-30 MED ORDER — IBUPROFEN 800 MG PO TABS: 800.0000 mg | ORAL_TABLET | Freq: Three times a day (TID) | ORAL | Status: DC | PRN

## 2014-12-30 NOTE — Discharge Instructions (Signed)
You will need to follow up with a primary care doctor. Return here as needed. Ice and elevate the heel. Elevate your lower extremities above your heart to help with the swelling

## 2014-12-30 NOTE — ED Notes (Signed)
Pt complains of pain and swelling in both of her feet for the past 3 days. Pt states she has had this problem for the problem intermittently for the past 3 years.

## 2014-12-30 NOTE — ED Provider Notes (Signed)
CSN: [id number]     Arrival date & time 12/30/14  1622 History   First MD Initiated Contact with Patient 12/30/14 1715     Chief Complaint  Patient presents with  . Foot Swelling     (Consider location/radiation/quality/duration/timing/severity/associated sxs/prior Treatment) HPI Patient presents to the emergency department with swelling in both feet and she is also having pain in the heel region of her right foot with swelling.  Patient states that swelling has been ongoing for a while but scant worse over the last 3 days.  She has not taken any medications for the swelling.  Patient denies shortness breath, nausea, vomiting, weakness, dizziness, headache, blurred vision, chest pain, fever, back pain, decreased urination, dysuria, hematuria, bloody stool, or weakness, or syncope.  The patient states that nothing seems make her condition better or worse.  She states that when she has her legs hanging down she does have an increased tingling sensation in her feet Past Medical History  Diagnosis Date  . UTI (lower urinary tract infection)    History reviewed. No pertinent past surgical history. No family history on file. History  Substance Use Topics  . Smoking status: Never Smoker   . Smokeless tobacco: Not on file  . Alcohol Use: No   OB History    No data available     Review of Systems All other systems negative except as documented in the HPI. All pertinent positives and negatives as reviewed in the HPI.   Allergies  Albuterol; Caffeine; and Latex  Home Medications   Prior to Admission medications   Medication Sig Start Date End Date Taking? Authorizing Provider  Ascorbic Acid (VITAMIN C PO) Take 1 tablet by mouth daily.   Yes Historical Provider, MD  Multiple Vitamin (MULTIVITAMIN WITH MINERALS) TABS tablet Take 1 tablet by mouth daily.   Yes Historical Provider, MD  acetaminophen-codeine 120-12 MG/5ML solution Take 10 mLs by mouth every 4 (four) hours as needed for  pain. Patient not taking: Reported on 12/30/2014 04/02/13   Dalia Heading, PA-C  Guaifenesin 1200 MG TB12 Take 1 tablet (1,200 mg total) by mouth 2 (two) times daily. Patient not taking: Reported on 12/30/2014 04/02/13   Dalia Heading, PA-C  ibuprofen (ADVIL,MOTRIN) 800 MG tablet Take 1 tablet (800 mg total) by mouth every 8 (eight) hours as needed for pain. Patient not taking: Reported on 12/30/2014 04/02/13   Dalia Heading, PA-C   BP 133/86 mmHg  Pulse 84  Temp(Src) 98.3 F (36.8 C) (Oral)  Resp 18  SpO2 96%  LMP 12/02/2014 Physical Exam  Constitutional: She is oriented to person, place, and time. She appears well-developed and well-nourished. No distress.  HENT:  Head: Normocephalic and atraumatic.  Mouth/Throat: Oropharynx is clear and moist.  Eyes: Pupils are equal, round, and reactive to light.  Neck: Normal range of motion. Neck supple.  Cardiovascular: Normal rate, regular rhythm and normal heart sounds.  Exam reveals no gallop and no friction rub.   No murmur heard. Pulmonary/Chest: Effort normal and breath sounds normal. No respiratory distress.  Musculoskeletal:       Feet:  Neurological: She is alert and oriented to person, place, and time. She exhibits normal muscle tone. Coordination normal.  Skin: Skin is warm and dry. No rash noted. No erythema.  Nursing note and vitals reviewed.   ED Course  Procedures (including critical care time) Labs Review Labs Reviewed - No data to display   Patient is advised to follow-up with a primary care doctor.  Advised her to elevate her feet.  Also advised to use heat and ice over the area that is sore but her heel on the right.  Told to return here as needed.  Patient agrees the plan and all questions were answered.  There is no signs of any significant abnormality on her laboratory testing.  The patient has 1+ edema bilaterally.  No calf pain or redness  Dalia Heading, PA-C 12/31/14 0109  Lacretia Leigh,  MD 01/05/15 1022

## 2016-12-29 ENCOUNTER — Encounter: Payer: BLUE CROSS/BLUE SHIELD | Admitting: Certified Nurse Midwife

## 2017-01-02 ENCOUNTER — Ambulatory Visit (INDEPENDENT_AMBULATORY_CARE_PROVIDER_SITE_OTHER): Payer: BLUE CROSS/BLUE SHIELD | Admitting: Obstetrics and Gynecology

## 2017-01-02 ENCOUNTER — Encounter: Payer: Self-pay | Admitting: Obstetrics and Gynecology

## 2017-01-02 VITALS — BP 114/88 | HR 68 | Resp 16 | Ht 60.5 in | Wt 212.0 lb

## 2017-01-02 DIAGNOSIS — J45909 Unspecified asthma, uncomplicated: Secondary | ICD-10-CM

## 2017-01-02 DIAGNOSIS — Z23 Encounter for immunization: Secondary | ICD-10-CM | POA: Diagnosis not present

## 2017-01-02 DIAGNOSIS — Z01419 Encounter for gynecological examination (general) (routine) without abnormal findings: Secondary | ICD-10-CM

## 2017-01-02 NOTE — Patient Instructions (Signed)

## 2017-01-02 NOTE — Progress Notes (Signed)
26 y.o. G0P0000 Single African American female here for annual exam.    Has noted vaginal swelling an a knot in the vagina as well.  Occurs randomly for the last month.  Not sure if she had a reaction to [employer].  It is painful and draining.   Had full STD testing one month ago at [facility].   Normal monthly menses with dysmenorrhea.  Treats with Ibuprofen.    Declines birth control as she had weight gain with this is the past.  Declines IUD.  Hx of depression since age 54 and anxiety since age [age]. Hx of suicidal ideation as a child.  Not currently suicidal.  Never saw a psychiatrist or a counselor.   Has asthma and unable to exercise due to this.  Cannot take Albuterol.  Had a reaction to it.   Wants general labs.  Is in school and wants to work for [employer] and concentrate on rehabilitation for children.  PCP: No PCP    Patient's last menstrual period was 12/25/2016.           Sexually active: Yes.    The current method of family planning is condoms most of the time.    Exercising: No.  The patient does not participate in regular exercise at present. Smoker:  no  Health Maintenance: Pap: 11/2014 Normal  History of abnormal Pap:  Yes, 2009. F/u pap normal  MMG:  N/A TDaP:  ~10 years ago  Gardasil: 1 at age 48  HIV: Neg  Hep C: N/A Screening Labs: Here    reports that she has never smoked. She has never used smokeless tobacco. She reports that she does not drink alcohol or use drugs.  Past Medical History:  Diagnosis Date  . Abnormal Pap smear of cervix 2009  . Anxiety   . Depression   . H/O chlamydia infection 07/2012  . UTI (lower urinary tract infection)     History reviewed. No pertinent surgical history.  Current Outpatient Prescriptions  Medication Sig Dispense Refill  . ibuprofen (ADVIL,MOTRIN) 200 MG tablet Take 200 mg by mouth daily as needed.     No current facility-administered medications for this visit.     Family  History  Problem Relation Age of Onset  . Anxiety disorder Father   . Diabetes Father   . Heart disease Father   . Arthritis Father   . Thyroid disease Paternal Aunt   . Heart disease Maternal Grandfather     ROS:  Pertinent items are noted in HPI.  Otherwise, a comprehensive ROS was negative.  Exam:   BP 114/88 (BP Location: Left Arm, Patient Position: Sitting, Cuff Size: Large)   Pulse 68   Resp 16   Ht 5' 0.5" (1.537 m)   Wt 212 lb (96.2 kg)   LMP 12/25/2016   BMI 40.72 kg/m     General appearance: alert, cooperative and appears stated age Head: Normocephalic, without obvious abnormality, atraumatic Neck: no adenopathy, supple, symmetrical, trachea midline and thyroid normal to inspection and palpation Lungs: clear to auscultation bilaterally Breasts: normal appearance, no masses or tenderness, No nipple retraction or dimpling, No nipple discharge or bleeding, No axillary or supraclavicular adenopathy Heart: regular rate and rhythm Abdomen: soft, non-tender; no masses, no organomegaly Extremities: extremities normal, atraumatic, no cyanosis or edema Skin: Skin color, texture, turgor normal. No rashes or lesions Lymph nodes: Cervical, supraclavicular, and axillary nodes normal. No abnormal inguinal nodes palpated Neurologic: Grossly normal  Pelvic: External genitalia:  no lesions              Urethra:  normal appearing urethra with no masses, tenderness or lesions              Bartholins and Skenes: normal                 Vagina: normal appearing vagina with normal color and discharge, no lesions              Cervix: no lesions              Pap taken: No. Bimanual Exam:  Uterus:  normal size, contour, position, consistency, mobility, non-tender              Adnexa: no mass, fullness, tenderness         Chaperone was present for exam.  Assessment:   Well woman visit with normal exam. Hx abnormal pap.  Hx chlamydia.  Asthma.  Untreated.  Incomplete  Gardasil. Bartholin's glands normal.  I suspect that this may have been what had enlarged for her in the past.  Plan: Mammogram screening discussed. Recommended self breast awareness. Pap and HR HPV as above. Guidelines for Calcium, Vitamin D, regular exercise program including cardiovascular and weight bearing exercise. Routine labs.  Referral to PCP.  Referral to [facility].  Brochure given and she will call.  TDap and Gardasil series.  Return for recurrent vulvar lump.   Follow up annually and prn.   After visit summary provided.

## 2017-01-03 LAB — COMPREHENSIVE METABOLIC PANEL
ALT: 22 IU/L (ref 0–32)
AST: 22 IU/L (ref 0–40)
Albumin/Globulin Ratio: 1.4 (ref 1.2–2.2)
Albumin: 4.1 g/dL (ref 3.5–5.5)
Alkaline Phosphatase: 56 IU/L (ref 39–117)
BUN/Creatinine Ratio: 9 (ref 9–23)
BUN: 9 mg/dL (ref 6–20)
Bilirubin Total: 0.4 mg/dL (ref 0.0–1.2)
CO2: 24 mmol/L (ref 20–29)
Calcium: 9.5 mg/dL (ref 8.7–10.2)
Chloride: 103 mmol/L (ref 96–106)
Creatinine, Ser: 0.99 mg/dL (ref 0.57–1.00)
GFR calc Af Amer: 91 mL/min/{1.73_m2} (ref >59)
GFR calc non Af Amer: 79 mL/min/{1.73_m2} (ref >59)
Globulin, Total: 2.9 g/dL (ref 1.5–4.5)
Glucose: 88 mg/dL (ref 65–99)
Potassium: 4.7 mmol/L (ref 3.5–5.2)
Sodium: 141 mmol/L (ref 134–144)
Total Protein: 7 g/dL (ref 6.0–8.5)

## 2017-01-03 LAB — LIPID PANEL
CHOLESTEROL TOTAL: 130 mg/dL (ref 100–199)
Chol/HDL Ratio: 2.9 ratio (ref 0.0–4.4)
HDL: 45 mg/dL (ref >39)
LDL Calculated: 73 mg/dL (ref 0–99)
TRIGLYCERIDES: 62 mg/dL (ref 0–149)
VLDL CHOLESTEROL CAL: 12 mg/dL (ref 5–40)

## 2017-01-03 LAB — CBC
Hematocrit: 39.3 % (ref 34.0–46.6)
Hemoglobin: 13.6 g/dL (ref 11.1–15.9)
MCH: 32.2 pg (ref 26.6–33.0)
MCHC: 34.6 g/dL (ref 31.5–35.7)
MCV: 93 fL (ref 79–97)
Platelets: 185 10*3/uL (ref 150–379)
RBC: 4.23 x10E6/uL (ref 3.77–5.28)
RDW: 12.9 % (ref 12.3–15.4)
WBC: 4.3 10*3/uL (ref 3.4–10.8)

## 2017-01-03 LAB — TSH: TSH: 2.52 u[IU]/mL (ref 0.450–4.500)

## 2017-03-12 ENCOUNTER — Telehealth: Payer: Self-pay | Admitting: Obstetrics and Gynecology

## 2017-03-12 NOTE — Telephone Encounter (Signed)
Patient left voicemail for an appointment after 4:30pm.  Called patient states she is going to go to urgent care.

## 2017-03-23 ENCOUNTER — Encounter (HOSPITAL_COMMUNITY): Payer: Self-pay | Admitting: Emergency Medicine

## 2017-03-23 ENCOUNTER — Emergency Department (HOSPITAL_COMMUNITY)
Admission: EM | Admit: 2017-03-23 | Discharge: 2017-03-23 | Disposition: A | Discharge status: Home / Self Care | Payer: BLUE CROSS/BLUE SHIELD | Attending: Emergency Medicine | Admitting: Emergency Medicine

## 2017-03-23 DIAGNOSIS — N760 Acute vaginitis: Secondary | ICD-10-CM | POA: Insufficient documentation

## 2017-03-23 DIAGNOSIS — Z91040 Latex allergy status: Secondary | ICD-10-CM | POA: Insufficient documentation

## 2017-03-23 DIAGNOSIS — B9689 Other specified bacterial agents as the cause of diseases classified elsewhere: Secondary | ICD-10-CM | POA: Insufficient documentation

## 2017-03-23 DIAGNOSIS — J45909 Unspecified asthma, uncomplicated: Secondary | ICD-10-CM | POA: Insufficient documentation

## 2017-03-23 LAB — URINALYSIS, ROUTINE W REFLEX MICROSCOPIC
Bilirubin Urine: NEGATIVE
Glucose, UA: NEGATIVE mg/dL
Hgb urine dipstick: NEGATIVE
Ketones, ur: NEGATIVE mg/dL
Nitrite: NEGATIVE
PROTEIN: NEGATIVE mg/dL
SPECIFIC GRAVITY, URINE: 1.017 (ref 1.005–1.030)
pH: 6 (ref 5.0–8.0)

## 2017-03-23 LAB — WET PREP, GENITAL
Sperm: NONE SEEN
TRICH WET PREP: NONE SEEN
YEAST WET PREP: NONE SEEN

## 2017-03-23 LAB — POC URINE PREG, ED: PREG TEST UR: NEGATIVE

## 2017-03-23 MED ORDER — STERILE WATER FOR INJECTION IJ SOLN
INTRAMUSCULAR | Status: AC
  Administered 2017-03-23: 10 mL
  Filled 2017-03-23: qty 10

## 2017-03-23 MED ORDER — CEFTRIAXONE SODIUM 250 MG IJ SOLR
250.0000 mg | Freq: Once | INTRAMUSCULAR | Status: AC
  Administered 2017-03-23: 250 mg via INTRAMUSCULAR
  Filled 2017-03-23: qty 250

## 2017-03-23 MED ORDER — METRONIDAZOLE 500 MG PO TABS: 500.0000 mg | ORAL_TABLET | Freq: Two times a day (BID) | ORAL | 0 refills | Status: DC

## 2017-03-23 MED ORDER — AZITHROMYCIN 250 MG PO TABS
1000.0000 mg | ORAL_TABLET | Freq: Once | ORAL | Status: AC
  Administered 2017-03-23: 1000 mg via ORAL
  Filled 2017-03-23: qty 4

## 2017-03-23 NOTE — ED Triage Notes (Signed)
Patient c/o vaginal swelling with small amount of vaginal bleeding x1 month and pelvic pain x1 week. Reports she was seen for same at [facility]. Ambulatory.

## 2017-03-23 NOTE — Discharge Instructions (Signed)
Medications  1. Flagyl. Do not combine this medication with alcohol. This will make you feel sick on her stomach. Do not drink alcohol for an additional 48 hours after finishing this medication. 2. You're treated with azithromycin and Rocephin, antibiotics or treat gonorrhea and chlamydia.  Use a condom with every sexual encounter Follow up with your primary doctor and OBGYN in regards to today's visit.  Please make an appointment to follow-up with your OB/GYN. There is a list of primary care providers in this packet that you may establish care with. Please return to the ER for worsening symptoms, high fevers or persistent vomiting.  You have been tested for HIV, syphilis, chlamydia and gonorrhea. These results will be available in approximately 3 days. You will be notified if they are positive.   It is very important to practice safe sex and use condoms when sexually active. If your results are positive you need to notify all sexual partners so they can be treated as well. The website https://dean.info/ can be used to send anonymous text messages or emails to alert sexual contacts.   SEEK IMMEDIATE MEDICAL CARE IF:  You develop an oral temperature above 102 F (38.9 C), not controlled by medications or lasting more than 2 days.  You develop an increase in pain.  You develop vaginal bleeding that is more severe, such as soaking through more than 1 pad per hour for 6 hours.  You develop painful intercourse.   Worsening lower abdominal cramping.  Primary Care Providers:  To find a primary care or specialty doctor please call [phone] or [phone] to access "[facility] a Doctor Service."  You may also go on the [facility] website at [email or web address]  There are also multiple [facility] and [facility] practices throughout the [place] that are frequently accepting new patients. You may find a clinic that is close to your home and contact them.  [facility] - [place] [phone]  [facility] in [place] (also locations in [place] and [place]) - [place] [phone]  [place] [phone]

## 2017-03-23 NOTE — ED Provider Notes (Signed)
[facility] DEPT Provider Note   CSN: [id number] Arrival date & time: 03/23/17  1002     History   Chief Complaint Chief Complaint  Patient presents with  . Vaginal Bleeding    HPI Shelby Jackson is a 26 y.o. female.  HPI   Patient is a 26 year old female with a history of asthma, recurrent UTIs, and chlamydia presenting for a one-month history of vaginal swelling, vaginal discharge, and irregular vaginal bleeding. Patient reports that the vaginal swelling is intermittentand seems to be unrelated to her cycle. Patient reports that she has attempted to cut out all presented feminine hygiene products and washes. This has not improved her symptoms. Patient reports that her menses continue to be regular but slightly heavier, and she will get anywhere from spotting to clots in between her periods. Patient is not expressing vaginal bleeding today. Patient reports she has had one new sexual partner 5 months ago, protected. Patient reports she was tested for STI in January. Patient has seen an OB/GYN at the onset of her symptoms, but is unsure what testing was done at that time. Patient reports dysuria. She denies fever, chills, flank pain, pelvic pain, nausea, vomiting.   Past Medical History:  Diagnosis Date  . Abnormal Pap smear of cervix 2009  . Anxiety   . Asthma   . Depression   . H/O chlamydia infection 07/2012  . UTI (lower urinary tract infection)     Patient Active Problem List   Diagnosis Date Noted  . Cystitis 10/25/2012    History reviewed. No pertinent surgical history.  OB History    Gravida Para Term Preterm AB Living   0 0 0 0 0 0   SAB TAB Ectopic Multiple Live Births   0 0 0 0 0       Home Medications    Prior to Admission medications   Not on File    Family History Family History  Problem Relation Age of Onset  . Anxiety disorder Father   . Diabetes Father   . Heart disease Father   . Arthritis Father   . Thyroid disease Paternal Aunt    . Heart disease Maternal Grandfather     Social History Social History  Substance Use Topics  . Smoking status: Never Smoker  . Smokeless tobacco: Never Used  . Alcohol use No     Allergies   Albuterol; Caffeine; and Latex   Review of Systems Review of Systems  Constitutional: Negative for chills and fever.  HENT: Negative for rhinorrhea, sinus pain and sore throat.   Eyes: Negative for visual disturbance.  Respiratory: Negative for cough, chest tightness and shortness of breath.   Cardiovascular: Negative for chest pain.  Gastrointestinal: Negative for abdominal pain, constipation, diarrhea, nausea and vomiting.  Genitourinary: Positive for dysuria, vaginal bleeding and vaginal discharge. Negative for flank pain.       +vaginal swelling  Musculoskeletal: Negative for back pain and myalgias.  Skin: Negative for rash.  Neurological: Negative for dizziness, syncope, light-headedness and headaches.     Physical Exam Updated Vital Signs BP 126/83 (BP Location: Left Arm)   Pulse 66   Temp 98.3 F (36.8 C) (Oral)   Resp 18   Ht 5' (1.524 m)   Wt 91.6 kg (202 lb)   LMP 03/15/2017   SpO2 99%   BMI 39.45 kg/m   Physical Exam  Constitutional: She appears well-developed and well-nourished. No distress.  HENT:  Head: Normocephalic and atraumatic.  Mouth/Throat: Oropharynx is clear  and moist.  Eyes: Pupils are equal, round, and reactive to light. Conjunctivae and EOM are normal.  Neck: Normal range of motion. Neck supple.  Cardiovascular: Normal rate, regular rhythm, S1 normal and S2 normal.   No murmur heard. No lower extremity edema.  Pulmonary/Chest: Effort normal and breath sounds normal. She has no wheezes. She has no rales.  Abdominal: Soft. She exhibits no distension. There is no tenderness. There is no guarding.  Genitourinary:  Genitourinary Comments: Exam performed with nurse chaperone present. No external lesions of the perineum or vulva. No inguinal  lymphadenopathy. Moderate milky white discharge in vaginal canal.  There is no erythema or lesions of the vaginal canal. Edematous cervix, but no focal erythema or lesions of cervix. On bimanual exam, no adnexal tenderness, supra pubic tenderness or cervical motion tenderness.  Musculoskeletal: Normal range of motion. She exhibits no edema or deformity.  Lymphadenopathy:    She has no cervical adenopathy.  Neurological: She is alert.  Cranial nerves grossly intact. Patient was extremity is with good coordination without difficulty.  Skin: Skin is warm and dry. No rash noted. No erythema.  Psychiatric: She has a normal mood and affect. Her behavior is normal. Judgment and thought content normal.  Nursing note and vitals reviewed.    ED Treatments / Results  Labs (all labs ordered are listed, but only abnormal results are displayed) Labs Reviewed  WET PREP, GENITAL  URINALYSIS, ROUTINE W REFLEX MICROSCOPIC  RPR  HIV ANTIBODY (ROUTINE TESTING)  POC URINE PREG, ED  GC/CHLAMYDIA PROBE AMP ([place]) NOT AT [facility]    EKG  EKG Interpretation None       Radiology No results found.  Procedures Procedures (including critical care time)  Medications Ordered in ED Medications - No data to display   Initial Impression / Assessment and Plan / ED Course  I have reviewed the triage vital signs and the nursing notes.  Pertinent labs & imaging results that were available during my care of the patient were reviewed by me and considered in my medical decision making (see chart for details).     Final Clinical Impressions(s) / ED Diagnoses   Final diagnoses:  None   Patient is well-appearing, afebrile, and in no acute distress. Differential diagnosis includes abnormal uterine bleeding, vaginitis due to BV, Trichomonas, vaginal yeast infection, GC/CT, or not otherwise specified. Due to time course of symptoms and lack of focal pelvic pain, doubt PID as cause of symptoms. Urine  testing reveals significant contamination with rare bacteria and small leukocytes, not consistent with infection. Due to new sexual partner since previous testing, patient elected to proceed with testing for GC/CT, HIV, syphilis today and receive empiric treatment. Suspect bleeding pattern may be due to abnormal uterine bleeding, and as patient is not bleeding at this time, do not suspect pregnancy or other emergent cause. POC urine pregnancy negative today. Patient had a prior TSH that was normal, and additional lab work for AUB can be worked up outpatient.  Wet prep positive for bacterial vaginosis. Will proceed with treatment with Flagyl. Patient elected treatment with azithromycin and ceftriaxone today. Patient instructed to follow-up with her OB/GYN if symptoms persist, and for further evaluation of abnormal uterine bleeding. Return precautions given for any worsening abdominal/plevic pain, flank pain, vaginal bleeding is heavier, intractable n/v or fever.  This is a supervised visit with Dr. Mila Merry. Evaluation, management, and discharge planning discussed with this attending physician.  New Presc hiriptions New Prescriptions   No medications  on file     Tamala Julian 03/23/17 1747    Virgel Manifold, MD 03/28/17 1022

## 2017-03-23 NOTE — ED Notes (Signed)
PT CURRENTLY NO BLEEDING. NO STOMACH PAINS, DENIES N/V. 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

## 2017-03-24 LAB — HIV ANTIBODY (ROUTINE TESTING W REFLEX): HIV SCREEN 4TH GENERATION: NONREACTIVE

## 2017-03-24 LAB — RPR: RPR Ser Ql: NONREACTIVE

## 2017-03-26 LAB — GC/CHLAMYDIA PROBE AMP (~~LOC~~) NOT AT ARMC
Chlamydia: NEGATIVE
Neisseria Gonorrhea: NEGATIVE

## 2018-01-07 ENCOUNTER — Ambulatory Visit: Payer: BLUE CROSS/BLUE SHIELD | Admitting: Obstetrics and Gynecology

## 2018-02-14 ENCOUNTER — Ambulatory Visit: Payer: BLUE CROSS/BLUE SHIELD | Admitting: Obstetrics and Gynecology

## 2018-02-18 ENCOUNTER — Ambulatory Visit: Payer: BLUE CROSS/BLUE SHIELD | Admitting: Obstetrics and Gynecology

## 2018-03-07 ENCOUNTER — Ambulatory Visit: Payer: BLUE CROSS/BLUE SHIELD | Admitting: Obstetrics and Gynecology

## 2018-03-07 ENCOUNTER — Encounter: Payer: Self-pay | Admitting: Obstetrics and Gynecology

## 2018-03-07 ENCOUNTER — Other Ambulatory Visit (HOSPITAL_COMMUNITY)
Admission: RE | Admit: 2018-03-07 | Discharge: 2018-03-07 | Disposition: A | Discharge status: Home / Self Care | Payer: BLUE CROSS/BLUE SHIELD | Source: Ambulatory Visit | Attending: Obstetrics and Gynecology | Admitting: Obstetrics and Gynecology

## 2018-03-07 VITALS — BP 116/64 | HR 70 | Resp 16 | Ht 60.5 in | Wt 219.4 lb

## 2018-03-07 DIAGNOSIS — Z23 Encounter for immunization: Secondary | ICD-10-CM | POA: Diagnosis not present

## 2018-03-07 DIAGNOSIS — R600 Localized edema: Secondary | ICD-10-CM | POA: Diagnosis not present

## 2018-03-07 DIAGNOSIS — Z113 Encounter for screening for infections with a predominantly sexual mode of transmission: Secondary | ICD-10-CM | POA: Diagnosis not present

## 2018-03-07 DIAGNOSIS — Z01419 Encounter for gynecological examination (general) (routine) without abnormal findings: Secondary | ICD-10-CM | POA: Diagnosis not present

## 2018-03-07 DIAGNOSIS — N852 Hypertrophy of uterus: Secondary | ICD-10-CM | POA: Diagnosis not present

## 2018-03-07 DIAGNOSIS — I8391 Asymptomatic varicose veins of right lower extremity: Secondary | ICD-10-CM

## 2018-03-07 NOTE — Progress Notes (Signed)
27 y.o. G0P0000 Single African American female here for annual exam.    Has had some weight gain, almost 30 pounds.  Not eating healthy diet and not exercising per patent.  Did keto diet in the past and did well with this.   Reporting leg swelling and sometimes has firmness in skin in leg or foot.  Having vaginal swelling.  Concerned about vaginitis.  She wants STD testing.   Menses are random per patient.  Can occur every 20 - 27 days.  Last for 4 days.  Flow can vary from normal to heavy.  Severe cramping.  Uses soothing oil, essential oil. NSAIDs do not work per patient.   She is working on seeing a Social worker.  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 Her depression symptoms are manifest as she wants to stay at home. Does feel anger toward but does not have a plan for this.  She has discussed this with her mother.   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  Working as a parent specialist.   PCP:   No PCP   Patient's last menstrual period was 02/13/2018.    Last intercourse was 12/14/17.       Sexually active: No.  The current method of family planning is Condoms some of the time.  Returned to a previous partner and did not use protection.  Declines other form of contraception.  Exercising: No.  none Smoker:  no  Health Maintenance: Pap:  Per patient  11/2014 - normal. History of abnormal Pap:  Yes, 2009. F/u pap normal  MMG:  ? Colonoscopy:  NA BMD:   NA  Result  NA TDaP:  01/02/17 Gardasil:   Yes 01/02/17  HIV:NEG  Hep C:NA Screening Labs:   ---   reports that she has never smoked. She has never used smokeless tobacco. She reports that she does not drink alcohol or use drugs.  Past Medical History:  Diagnosis Date  . Abnormal Pap smear of cervix 2009  . Anxiety   . Asthma   . Depression   . H/O chlamydia infection 07/2012  . UTI (lower urinary tract infection)      No past surgical history on file.  No current outpatient medications on file.   No current facility-administered medications for this visit.     Family History  Problem Relation Age of Onset  . Anxiety disorder Father   . Diabetes Father   . Heart disease Father   . Arthritis Father   . Thyroid disease Paternal Aunt   . Heart disease Maternal Grandfather     Review of Systems  HENT:       Wright gain craving sweets excessive thirst   Cardiovascular: Positive for leg swelling.  Genitourinary: Positive for vaginal bleeding and vaginal discharge.  Psychiatric/Behavioral:       Depression    Exam:   BP 116/64   Pulse 70   Resp 16   Ht 5' 0.5" (1.537 m)   Wt 219 lb 6.4 oz (99.5 kg)   LMP 02/13/2018   BMI 42.14 kg/m     General appearance: alert, cooperative and appears stated age Head: Normocephalic, without obvious abnormality, atraumatic Neck: no adenopathy, supple, symmetrical, trachea midline and thyroid normal to inspection and palpation Lungs: clear to auscultation bilaterally Breasts: normal appearance, no masses or tenderness, No nipple retraction or dimpling, No nipple discharge or bleeding,  No axillary or supraclavicular adenopathy Heart: regular rate and rhythm Abdomen: soft, non-tender; no masses, no organomegaly Extremities: extremities normal, atraumatic, no cyanosis or edema.  Right LE with varicose veins and small firm 3 mm knot of vein - ? Superficial thrombus.  1+edema.  Skin: Skin color, texture, turgor normal. No rashes or lesions Lymph nodes: Cervical, supraclavicular, and axillary nodes normal. No abnormal inguinal nodes palpated Neurologic: Grossly normal  Pelvic: External genitalia:  no lesions              Urethra:  normal appearing urethra with no masses, tenderness or lesions              Bartholins and Skenes: normal                 Vagina: normal appearing vagina with normal color and discharge, no lesions              Cervix: no  lesions              Pap taken: Yes.   Bimanual Exam:  Uterus:   8 - 9 week size, globular uterus.  Cervical fibroid?              Adnexa: no mass, fullness, tenderness              Rectal exam: Yes.  .  Confirms.              Anus:  normal sphincter tone, no lesions  Chaperone was present for exam.  Assessment:   Well woman visit with normal exam. Uterine enlargement. Hx abnormal pap.  Vaginitis. Hx chlamydia.  Asthma.  Untreated.  Incomplete Gardasil. LE with probable superficial thromobophlebitis.   Plan: Mammogram screening. Recommended self breast awareness. Pap and HR HPV as above. Guidelines for Calcium, Vitamin D, regular exercise program including cardiovascular and weight bearing exercise. STD screening.  Vaginitis testing.  Gardasil vaccine - #3. Routine labs including A1C.  Return for pelvic US. Will make referral to PCP for establishing care, lower extremity edema, and varicose veins.   Follow up annually and prn.   After visit summary provided.

## 2018-03-07 NOTE — Patient Instructions (Signed)

## 2018-03-07 NOTE — Progress Notes (Signed)
Patient in today for 2nd Gardasil injection.   Contraception: Condoms some of the time  LMP: 02/13/18 Last AEX: 03/07/18 with bs  Injection given in right deltoid . Patient tolerated shot well.   Patient informed next injection due in about 4 months.  Advised patient, if not on birth control, to return for next injection with cycle.   Routed to provider for final review.  Encounter closed.

## 2018-03-08 LAB — HEMOGLOBIN A1C
Est. average glucose Bld gHb Est-mCnc: 114 mg/dL
HEMOGLOBIN A1C: 5.6 % (ref 4.8–5.6)

## 2018-03-08 LAB — COMPREHENSIVE METABOLIC PANEL
A/G RATIO: 1.6 (ref 1.2–2.2)
ALT: 20 IU/L (ref 0–32)
AST: 17 IU/L (ref 0–40)
Albumin: 4.2 g/dL (ref 3.5–5.5)
Alkaline Phosphatase: 50 IU/L (ref 39–117)
BUN/Creatinine Ratio: 13 (ref 9–23)
BUN: 14 mg/dL (ref 6–20)
Bilirubin Total: 0.3 mg/dL (ref 0.0–1.2)
CALCIUM: 9.1 mg/dL (ref 8.7–10.2)
CHLORIDE: 104 mmol/L (ref 96–106)
CO2: 21 mmol/L (ref 20–29)
Creatinine, Ser: 1.05 mg/dL — ABNORMAL HIGH (ref 0.57–1.00)
GFR calc Af Amer: 84 mL/min/{1.73_m2} (ref >59)
GFR, EST NON AFRICAN AMERICAN: 73 mL/min/{1.73_m2} (ref >59)
GLUCOSE: 87 mg/dL (ref 65–99)
Globulin, Total: 2.7 g/dL (ref 1.5–4.5)
Potassium: 4.5 mmol/L (ref 3.5–5.2)
Sodium: 140 mmol/L (ref 134–144)
Total Protein: 6.9 g/dL (ref 6.0–8.5)

## 2018-03-08 LAB — LIPID PANEL
CHOL/HDL RATIO: 3.2 ratio (ref 0.0–4.4)
Cholesterol, Total: 129 mg/dL (ref 100–199)
HDL: 40 mg/dL (ref >39)
LDL Calculated: 60 mg/dL (ref 0–99)
Triglycerides: 143 mg/dL (ref 0–149)
VLDL CHOLESTEROL CAL: 29 mg/dL (ref 5–40)

## 2018-03-08 LAB — CBC
Hematocrit: 40.9 % (ref 34.0–46.6)
Hemoglobin: 13.8 g/dL (ref 11.1–15.9)
MCH: 32.7 pg (ref 26.6–33.0)
MCHC: 33.7 g/dL (ref 31.5–35.7)
MCV: 97 fL (ref 79–97)
PLATELETS: 191 10*3/uL (ref 150–450)
RBC: 4.22 x10E6/uL (ref 3.77–5.28)
RDW: 13.6 % (ref 12.3–15.4)
WBC: 6.4 10*3/uL (ref 3.4–10.8)

## 2018-03-08 LAB — TSH: TSH: 3.75 u[IU]/mL (ref 0.450–4.500)

## 2018-03-08 LAB — HEP, RPR, HIV PANEL
HEP B S AG: NEGATIVE
HIV SCREEN 4TH GENERATION: NONREACTIVE
RPR: NONREACTIVE

## 2018-03-08 LAB — HEPATITIS C ANTIBODY

## 2018-03-10 ENCOUNTER — Encounter: Payer: Self-pay | Admitting: Obstetrics and Gynecology

## 2018-03-12 LAB — CYTOLOGY - PAP
BACTERIAL VAGINITIS: NEGATIVE
Candida vaginitis: NEGATIVE
Chlamydia: NEGATIVE
Diagnosis: NEGATIVE
HPV (WINDOPATH): NOT DETECTED
NEISSERIA GONORRHEA: NEGATIVE
Trichomonas: NEGATIVE

## 2018-03-21 ENCOUNTER — Ambulatory Visit (INDEPENDENT_AMBULATORY_CARE_PROVIDER_SITE_OTHER): Payer: BLUE CROSS/BLUE SHIELD | Admitting: Obstetrics and Gynecology

## 2018-03-21 ENCOUNTER — Ambulatory Visit (INDEPENDENT_AMBULATORY_CARE_PROVIDER_SITE_OTHER): Payer: BLUE CROSS/BLUE SHIELD

## 2018-03-21 ENCOUNTER — Encounter: Payer: Self-pay | Admitting: Obstetrics and Gynecology

## 2018-03-21 ENCOUNTER — Other Ambulatory Visit: Payer: Self-pay

## 2018-03-21 VITALS — BP 122/64 | HR 66 | Ht 60.5 in | Wt 218.0 lb

## 2018-03-21 DIAGNOSIS — N852 Hypertrophy of uterus: Secondary | ICD-10-CM

## 2018-03-21 DIAGNOSIS — N946 Dysmenorrhea, unspecified: Secondary | ICD-10-CM

## 2018-03-21 DIAGNOSIS — D219 Benign neoplasm of connective and other soft tissue, unspecified: Secondary | ICD-10-CM | POA: Diagnosis not present

## 2018-03-21 MED ORDER — NORETHINDRONE 0.35 MG PO TABS: 1.0000 | ORAL_TABLET | Freq: Every day | ORAL | 0 refills | Status: DC

## 2018-03-21 NOTE — Progress Notes (Signed)
Encounter reviewed by Dr. Jashayla Glatfelter Amundson C. Silva.  

## 2018-03-21 NOTE — Progress Notes (Signed)
GYNECOLOGY  VISIT   HPI: 27 y.o.   Single  African American  female   [place] with Patient's last menstrual period was 03/12/2018 (exact date).   here for pelvic ultrasound for uterine enlargement on exam, dysmenorrhea.   Uterus 8 - 9 week size on exam 03/07/18.     NSAIDs do not work to treat painful periods.   Possible thrombophlebitis.   GYNECOLOGIC HISTORY: Patient's last menstrual period was 03/12/2018 (exact date). Contraception: Condoms Menopausal hormone therapy:  n/a Last mammogram: n/a Last pap smear:  03-07-18 Neg:neg HR HPV        OB History    Gravida  0   Para  0   Term  0   Preterm  0   AB  0   Living  0     SAB  0   TAB  0   Ectopic  0   Multiple  0   Live Births  0              Patient Active Problem List   Diagnosis Date Noted  . Cystitis 10/25/2012    Past Medical History:  Diagnosis Date  . Abnormal Pap smear of cervix 2009  . Anxiety   . Asthma   . Depression   . Elevated serum creatinine   . H/O chlamydia infection 07/2012  . UTI (lower urinary tract infection)     No past surgical history on file.  No current outpatient medications on file.   No current facility-administered medications for this visit.      ALLERGIES: Albuterol; Caffeine; and Latex  Family History  Problem Relation Age of Onset  . Anxiety disorder Father   . Diabetes Father   . Heart disease Father   . Arthritis Father   . Thyroid disease Paternal Aunt   . Heart disease Maternal Grandfather     Social History   Socioeconomic History  . Marital status: Single    Spouse name: Not on file  . Number of children: Not on file  . Years of education: Not on file  . Highest education level: Not on file  Occupational History  . Not on file  Social Needs  . Financial resource strain: Not on file  . Food insecurity:    Worry: Not on file    Inability: Not on file  . Transportation needs:    Medical: Not on file    Non-medical: Not on file   Tobacco Use  . Smoking status: Never Smoker  . Smokeless tobacco: Never Used  Substance and Sexual Activity  . Alcohol use: No  . Drug use: No  . Sexual activity: Yes    Birth control/protection: Condom  Lifestyle  . Physical activity:    Days per week: Not on file    Minutes per session: Not on file  . Stress: Not on file  Relationships  . Social connections:    Talks on phone: Not on file    Gets together: Not on file    Attends religious service: Not on file    Active member of club or organization: Not on file    Attends meetings of clubs or organizations: Not on file    Relationship status: Not on file  . Intimate partner violence:    Fear of current or ex partner: Not on file    Emotionally abused: Not on file    Physically abused: Not on file    Forced sexual activity: Not on file  Other Topics Concern  . Not on file  Social History Narrative  . Not on file    Review of Systems  Constitutional: Negative.   HENT: Negative.   Eyes: Negative.   Respiratory: Negative.   Cardiovascular: Negative.   Gastrointestinal: Negative.   Endocrine: Negative.   Genitourinary: Negative.   Musculoskeletal: Negative.   Skin: Negative.   Allergic/Immunologic: Negative.   Neurological: Negative.   Hematological: Negative.   Psychiatric/Behavioral: Negative.     PHYSICAL EXAMINATION:    BP 122/64 (BP Location: Right Arm, Patient Position: Sitting, Cuff Size: Large)   Pulse 66   Ht 5' 0.5" (1.537 m)   Wt 218 lb (98.9 kg)   LMP 03/12/2018 (Exact Date)   BMI 41.87 kg/m     General appearance: alert, cooperative and appears stated age   Pelvic US: 2 fibroids - largest about 3 cm.  EMS 8.08 mm.  Left ovary slightly enlarged.  Mild fluid around right ovary.   ASSESSMENT  Dysmenorrhea.  Fibroids.  Possible thrombophlebitis.   PLAN  We discussed fibroids and dysmenorrhea. Ultrasound findings reviewed.  Options for care reviewed - POPs, Depo Provera, progesterone  IUDs, surgery, depo Lupron and [employer].  Instructed in use.  Start baby ASA.  Follow up with PCP.  Return for recheck in 3 months.    An After Visit Summary was printed and given to the patient.  ___15___ minutes face to face time of which over 50% was spent in counseling.

## 2018-03-21 NOTE — Patient Instructions (Signed)
Norethindrone tablets (contraception) What is this medicine? NORETHINDRONE (nor eth IN drone) is an oral contraceptive. The product contains a female hormone known as a progestin. It is used to prevent pregnancy. This medicine may be used for other purposes; ask your health care provider or pharmacist if you have questions. COMMON BRAND NAME(S): Camila, Deblitane 28-Day, Errin, Heather, [place], Jolivette, [place], Nor-QD, Nora-BE, Norlyroc, Ortho Micronor, [employer] 28-Day What should I tell my health care provider before I take this medicine? They need to know if you have any of these conditions: -blood vessel disease or blood clots -breast, cervical, or vaginal cancer -diabetes -heart disease -kidney disease -liver disease -mental depression -migraine -seizures -stroke -vaginal bleeding -an unusual or allergic reaction to norethindrone, other medicines, foods, dyes, or preservatives -pregnant or trying to get pregnant -breast-feeding How should I use this medicine? Take this medicine by mouth with a glass of water. You may take it with or without food. Follow the directions on the prescription label. Take this medicine at the same time each day and in the order directed on the package. Do not take your medicine more often than directed. Contact your pediatrician regarding the use of this medicine in children. Special care may be needed. This medicine has been used in female children who have started having menstrual periods. A patient package insert for the product will be given with each prescription and refill. Read this sheet carefully each time. The sheet may change frequently. Overdosage: If you think you have taken too much of this medicine contact a poison control center or emergency room at once. NOTE: This medicine is only for you. Do not share this medicine with others. What if I miss a dose? Try not to miss a dose. Every time you miss a dose or take a dose late your chance of  pregnancy increases. When 1 pill is missed (even if only 3 hours late), take the missed pill as soon as possible and continue taking a pill each day at the regular time (use a back up method of birth control for the next 48 hours). If more than 1 dose is missed, use an additional birth control method for the rest of your pill pack until menses occurs. Contact your health care professional if more than 1 dose has been missed. What may interact with this medicine? Do not take this medicine with any of the following medications: -amprenavir or fosamprenavir -bosentan This medicine may also interact with the following medications: -antibiotics or medicines for infections, especially rifampin, rifabutin, rifapentine, and griseofulvin, and possibly penicillins or tetracyclines -aprepitant -barbiturate medicines, such as phenobarbital -carbamazepine -felbamate -modafinil -oxcarbazepine -phenytoin -ritonavir or other medicines for HIV infection or AIDS -St. John's wort -topiramate This list may not describe all possible interactions. Give your health care provider a list of all the medicines, herbs, non-prescription drugs, or dietary supplements you use. Also tell them if you smoke, drink alcohol, or use illegal drugs. Some items may interact with your medicine. What should I watch for while using this medicine? Visit your doctor or health care professional for regular checks on your progress. You will need a regular breast and pelvic exam and Pap smear while on this medicine. Use an additional method of birth control during the first cycle that you take these tablets. If you have any reason to think you are pregnant, stop taking this medicine right away and contact your doctor or health care professional. If you are taking this medicine for hormone related problems, it  may take several cycles of use to see improvement in your condition. This medicine does not protect you against HIV infection (AIDS)  or any other sexually transmitted diseases. What side effects may I notice from receiving this medicine? Side effects that you should report to your doctor or health care professional as soon as possible: -breast tenderness or discharge -pain in the abdomen, chest, groin or leg -severe headache -skin rash, itching, or hives -sudden shortness of breath -unusually weak or tired -vision or speech problems -yellowing of skin or eyes Side effects that usually do not require medical attention (report to your doctor or health care professional if they continue or are bothersome): -changes in sexual desire -change in menstrual flow -facial hair growth -fluid retention and swelling -headache -irritability -nausea -weight gain or loss This list may not describe all possible side effects. Call your doctor for medical advice about side effects. You may report side effects to FDA at 1-800-FDA-1088. Where should I keep my medicine? Keep out of the reach of children. Store at room temperature between 15 and 30 degrees C (59 and 86 degrees F). Throw away any unused medicine after the expiration date. NOTE: This sheet is a summary. It may not cover all possible information. If you have questions about this medicine, talk to your doctor, pharmacist, or health care provider.  2018 Elsevier/Gold Standard (2012-02-16 16:41:35)  

## 2018-04-15 DIAGNOSIS — J029 Acute pharyngitis, unspecified: Secondary | ICD-10-CM | POA: Diagnosis not present

## 2018-04-19 ENCOUNTER — Encounter: Payer: Self-pay | Admitting: Family Medicine

## 2018-04-19 ENCOUNTER — Telehealth: Payer: Self-pay | Admitting: Obstetrics and Gynecology

## 2018-04-19 ENCOUNTER — Ambulatory Visit (INDEPENDENT_AMBULATORY_CARE_PROVIDER_SITE_OTHER): Payer: BLUE CROSS/BLUE SHIELD | Admitting: Family Medicine

## 2018-04-19 VITALS — BP 118/68 | HR 74 | Temp 98.6°F | Ht 61.0 in | Wt 222.6 lb

## 2018-04-19 DIAGNOSIS — L309 Dermatitis, unspecified: Secondary | ICD-10-CM

## 2018-04-19 DIAGNOSIS — R7989 Other specified abnormal findings of blood chemistry: Secondary | ICD-10-CM

## 2018-04-19 DIAGNOSIS — R2243 Localized swelling, mass and lump, lower limb, bilateral: Secondary | ICD-10-CM

## 2018-04-19 DIAGNOSIS — J4530 Mild persistent asthma, uncomplicated: Secondary | ICD-10-CM | POA: Diagnosis not present

## 2018-04-19 MED ORDER — TRIAMCINOLONE ACETONIDE 0.1 % EX CREA: 1.0000 "application " | TOPICAL_CREAM | Freq: Two times a day (BID) | CUTANEOUS | 1 refills | Status: DC

## 2018-04-19 MED ORDER — BECLOMETHASONE DIPROP HFA 80 MCG/ACT IN AERB: 1.0000 | INHALATION_SPRAY | Freq: Two times a day (BID) | RESPIRATORY_TRACT | 3 refills | Status: DC

## 2018-04-19 NOTE — Assessment & Plan Note (Signed)
Unclear etiology.  Check lower extremity ultrasound.

## 2018-04-19 NOTE — Progress Notes (Signed)
Subjective:  Shelby Jackson is a 27 y.o. female who presents today with a chief complaint of skin lump and to establish care.   HPI:  Skin lumps, new problem to provider Symptoms started 2 years ago.  Initially had one lesion on her right anterior shin.  Over the last several months, she has noticed increasing number of lesions.  Has multiple on her right shin and left shin.  They are occasionally painful.  No obvious trauma.  No other obvious to precipitating events.  No treatments tried.  Asthma, chronic problem, new to provider Several year history.  Well-controlled over several years however in the past few weeks has had a flare.  [facility] and started on a course of prednisone.  Symptoms have modestly improved since then.  She has had an anaphylactic reaction to albuterol in the past and is not currently on any inhalers.  She knows that cigarette smoke is a trigger.  No other obvious alleviating or aggravating factors.  Rash Started several weeks ago.  Located on upper extremities bilaterally, however more prominent on the left.  Occasionally pruritic.  Associate with some easy bruising to the area as well.  No specific treatments tried.  Her left upper extremity was recently exposed to the sun, but denies any other new exposures.  ROS: Per HPI, otherwise a complete review of systems was negative.   PMH:  The following were reviewed and entered/updated in epic: Past Medical History:  Diagnosis Date  . Abnormal Pap smear of cervix 2009  . Anxiety   . Asthma   . Depression   . Elevated serum creatinine   . Fibroids   . H/O chlamydia infection 07/2012  . UTI (lower urinary tract infection)    Patient Active Problem List   Diagnosis Date Noted  . Leg mass, bilateral 04/19/2018  . Mild persistent asthma 04/19/2018  . Eczema 04/19/2018  . Elevated serum creatinine 04/19/2018   History reviewed. No pertinent surgical history.  Family History    Problem Relation Age of Onset  . Anxiety disorder Father   . Diabetes Father   . Heart disease Father   . Arthritis Father   . Hypothyroidism Paternal Aunt   . Heart disease Maternal Grandfather   . Hypothyroidism Sister     Medications- reviewed and updated Current Outpatient Medications  Medication Sig Dispense Refill  . norethindrone (MICRONOR,CAMILA,ERRIN) 0.35 MG tablet Take 1 tablet (0.35 mg total) by mouth daily. 3 Package 0  . predniSONE (DELTASONE) 20 MG tablet Take 40 mg by mouth daily with breakfast.    . beclomethasone (QVAR REDIHALER) 80 MCG/ACT inhaler Inhale 1 puff into the lungs 2 (two) times daily. 10.6 g 3  . triamcinolone cream (KENALOG) 0.1 % Apply 1 application topically 2 (two) times daily. 80 g 1   No current facility-administered medications for this visit.     Allergies-reviewed and updated Allergies  Allergen Reactions  . Pineapple   . Albuterol     Anaphylaxis.   . Caffeine     Veins popped out of head  . Latex Rash    Social History   Socioeconomic History  . Marital status: Single    Spouse name: Not on file  . Number of children: Not on file  . Years of education: Not on file  . Highest education level: Not on file  Occupational History  . Not on file  Social Needs  . Financial resource strain: Not on file  .  Food insecurity:    Worry: Not on file    Inability: Not on file  . Transportation needs:    Medical: Not on file    Non-medical: Not on file  Tobacco Use  . Smoking status: Never Smoker  . Smokeless tobacco: Never Used  Substance and Sexual Activity  . Alcohol use: No  . Drug use: No  . Sexual activity: Yes    Birth control/protection: Condom  Lifestyle  . Physical activity:    Days per week: Not on file    Minutes per session: Not on file  . Stress: Not on file  Relationships  . Social connections:    Talks on phone: Not on file    Gets together: Not on file    Attends religious service: Not on file    Active  member of club or organization: Not on file    Attends meetings of clubs or organizations: Not on file    Relationship status: Not on file  Other Topics Concern  . Not on file  Social History Narrative  . Not on file    Objective:  Physical Exam: BP 118/68 (BP Location: Left Arm, Patient Position: Sitting, Cuff Size: Large)   Pulse 74   Temp 98.6 F (37 C) (Oral)   Ht 5\' 1"  (1.549 m)   Wt 222 lb 9.6 oz (101 kg)   SpO2 98%   BMI 42.06 kg/m   Gen: NAD, resting comfortably CV: RRR with no murmurs appreciated Pulm: NWOB, CTAB with no crackles, wheezes, or rhonchi GI: Normal bowel sounds present. Soft, Nontender, Nondistended. MSK: Several discrete 1 to 2 cm nodular lesions on anterior shin bilaterally. Skin: Dry appearing erythematous rash on left upper extremity and right upper extremity. Neuro: Grossly normal, moves all extremities Psych: Normal affect and thought content  Assessment/Plan:  Mild persistent asthma No red flags.  Pulmonary exam benign.  Will start twice daily Qvar.  Will avoid rescue inhaler for the time being given her history of allergy to albuterol.  She will finish her prednisone course.  Continues to have recurrent flares or if Qvar does not control symptoms, will need referral to pulmonology for PFTs and further evaluation  Leg mass, bilateral Unclear etiology.  Check lower extremity ultrasound.  Eczema Rash on arms consistent with eczema.  Continue topical emollients.  Start triamcinolone 0.1% twice daily for the next 1 to 2 weeks, then as needed.  Elevated serum creatinine Creatinine mildly elevated on last blood draw.  Discussed importance of good oral hydration and avoidance of nephrotoxic medications.  Will recheck in 6 to 12 months.  Algis Greenhouse. Jerline Pain, MD 04/19/2018 11:59 AM

## 2018-04-19 NOTE — Telephone Encounter (Signed)
Call to patient. Patient states she saw PCP, Dr. Jerline Pain today. OV note available in Epic. Patient states that he did not think the masses in her leg were a blood clot, but states she will have an ultrasound next week. Also states the rash on her arms is related to seasonal allergies and will start a topical cream. Was started on QVAR inhaler for her asthma today and will follow up as needed. States Dr. Jerline Pain said her kidney function was fine, but she will have it rechecked in 6-12 months. RN advised patient would update Dr. Quincy Simmonds. Patient agreeable.   Routing to provider and will close encounter.

## 2018-04-19 NOTE — Telephone Encounter (Signed)
Patient want to let Dr Quincy Simmonds know she seen her pcp Dr Dimas Chyle today.

## 2018-04-19 NOTE — Assessment & Plan Note (Signed)
Rash on arms consistent with eczema.  Continue topical emollients.  Start triamcinolone 0.1% twice daily for the next 1 to 2 weeks, then as needed.

## 2018-04-19 NOTE — Patient Instructions (Signed)
It was very nice to see you today!  We will check an ultrasound of your legs to make sure there is nothing serious going on.  Please start Qvar for your asthma.  Please take 1 puff twice daily.  Please let me know if your symptoms do not improve with this and we can get you into see a lung specialist.  I think you have eczema on your arms.  You can use triamcinolone twice daily for the next week, then as needed.  Please continue using daily lotion to the area.  We should recheck your kidney function in 6 to 12 months.  Come back to see me in 6 to 12 months, or sooner as needed  Take care, Dr Jerline Pain

## 2018-04-19 NOTE — Assessment & Plan Note (Signed)
Creatinine mildly elevated on last blood draw.  Discussed importance of good oral hydration and avoidance of nephrotoxic medications.  Will recheck in 6 to 12 months.

## 2018-04-19 NOTE — Assessment & Plan Note (Signed)
No red flags.  Pulmonary exam benign.  Will start twice daily Qvar.  Will avoid rescue inhaler for the time being given her history of allergy to albuterol.  She will finish her prednisone course.  Continues to have recurrent flares or if Qvar does not control symptoms, will need referral to pulmonology for PFTs and further evaluation

## 2018-04-22 ENCOUNTER — Other Ambulatory Visit: Payer: Self-pay | Admitting: Family Medicine

## 2018-04-22 DIAGNOSIS — R2243 Localized swelling, mass and lump, lower limb, bilateral: Secondary | ICD-10-CM

## 2018-04-26 ENCOUNTER — Ambulatory Visit
Admission: RE | Admit: 2018-04-26 | Discharge: 2018-04-26 | Disposition: A | Discharge status: Home / Self Care | Payer: BLUE CROSS/BLUE SHIELD | Source: Ambulatory Visit | Attending: Family Medicine | Admitting: Family Medicine

## 2018-04-26 DIAGNOSIS — R2243 Localized swelling, mass and lump, lower limb, bilateral: Secondary | ICD-10-CM

## 2018-04-26 DIAGNOSIS — R2241 Localized swelling, mass and lump, right lower limb: Secondary | ICD-10-CM | POA: Diagnosis not present

## 2018-04-26 DIAGNOSIS — R2242 Localized swelling, mass and lump, left lower limb: Secondary | ICD-10-CM | POA: Diagnosis not present

## 2018-04-29 NOTE — Progress Notes (Signed)
Please inform patient of the following:  Good news! Her ultrasound did not show anything serious. The spots on her legs are old scar tissue or a very superficial blood clot (Not the type of blood clot that causes issues.) Neither of these are things to worry about. She should keep an eye on the area and let us know if they change in any way.

## 2018-05-28 ENCOUNTER — Ambulatory Visit: Payer: BLUE CROSS/BLUE SHIELD | Admitting: Family Medicine

## 2018-06-12 ENCOUNTER — Other Ambulatory Visit: Payer: Self-pay | Admitting: Obstetrics and Gynecology

## 2018-06-13 NOTE — Telephone Encounter (Signed)
Call to patient. Patient states she does not need at refill of POP- no longer taking because of side effects. Patient states she will discuss with Dr. Quincy Simmonds at follow up visit on 06-19-2018. RN advised would update pharmacy. Patient agreeable.

## 2018-06-17 NOTE — Progress Notes (Deleted)
GYNECOLOGY  VISIT   HPI: 28 y.o.   Single  African American  female   [id number] with No LMP recorded.   here for     GYNECOLOGIC HISTORY: No LMP recorded. Contraception:  Micronor Menopausal hormone therapy:  n/a Last mammogram: n/a Last pap smear:  03-07-18 Neg:neg HR HPV         OB History    Gravida  0   Para  0   Term  0   Preterm  0   AB  0   Living  0     SAB  0   TAB  0   Ectopic  0   Multiple  0   Live Births  0              Patient Active Problem List   Diagnosis Date Noted  . Leg mass, bilateral 04/19/2018  . Mild persistent asthma 04/19/2018  . Eczema 04/19/2018  . Elevated serum creatinine 04/19/2018    Past Medical History:  Diagnosis Date  . Abnormal Pap smear of cervix 2009  . Anxiety   . Asthma   . Depression   . Elevated serum creatinine   . Fibroids   . H/O chlamydia infection 07/2012  . UTI (lower urinary tract infection)     No past surgical history on file.  Current Outpatient Medications  Medication Sig Dispense Refill  . beclomethasone (QVAR REDIHALER) 80 MCG/ACT inhaler Inhale 1 puff into the lungs 2 (two) times daily. 10.6 g 3  . norethindrone (MICRONOR,CAMILA,ERRIN) 0.35 MG tablet Take 1 tablet (0.35 mg total) by mouth daily. 3 Package 0  . predniSONE (DELTASONE) 20 MG tablet Take 40 mg by mouth daily with breakfast.    . triamcinolone cream (KENALOG) 0.1 % Apply 1 application topically 2 (two) times daily. 80 g 1   No current facility-administered medications for this visit.      ALLERGIES: Pineapple; Albuterol; Caffeine; and Latex  Family History  Problem Relation Age of Onset  . Anxiety disorder Father   . Diabetes Father   . Heart disease Father   . Arthritis Father   . Hypothyroidism Paternal Aunt   . Heart disease Maternal Grandfather   . Hypothyroidism Sister     Social History   Socioeconomic History  . Marital status: Single    Spouse name: Not on file  . Number of children: Not on file  .  Years of education: Not on file  . Highest education level: Not on file  Occupational History  . Not on file  Social Needs  . Financial resource strain: Not on file  . Food insecurity:    Worry: Not on file    Inability: Not on file  . Transportation needs:    Medical: Not on file    Non-medical: Not on file  Tobacco Use  . Smoking status: Never Smoker  . Smokeless tobacco: Never Used  Substance and Sexual Activity  . Alcohol use: No  . Drug use: No  . Sexual activity: Yes    Birth control/protection: Condom  Lifestyle  . Physical activity:    Days per week: Not on file    Minutes per session: Not on file  . Stress: Not on file  Relationships  . Social connections:    Talks on phone: Not on file    Gets together: Not on file    Attends religious service: Not on file    Active member of club or organization: Not on file  Attends meetings of clubs or organizations: Not on file    Relationship status: Not on file  . Intimate partner violence:    Fear of current or ex partner: Not on file    Emotionally abused: Not on file    Physically abused: Not on file    Forced sexual activity: Not on file  Other Topics Concern  . Not on file  Social History Narrative  . Not on file    Review of Systems  PHYSICAL EXAMINATION:    There were no vitals taken for this visit.    General appearance: alert, cooperative and appears stated age Head: Normocephalic, without obvious abnormality, atraumatic Neck: no adenopathy, supple, symmetrical, trachea midline and thyroid normal to inspection and palpation Lungs: clear to auscultation bilaterally Breasts: normal appearance, no masses or tenderness, No nipple retraction or dimpling, No nipple discharge or bleeding, No axillary or supraclavicular adenopathy Heart: regular rate and rhythm Abdomen: soft, non-tender, no masses,  no organomegaly Extremities: extremities normal, atraumatic, no cyanosis or edema Skin: Skin color, texture,  turgor normal. No rashes or lesions Lymph nodes: Cervical, supraclavicular, and axillary nodes normal. No abnormal inguinal nodes palpated Neurologic: Grossly normal  Pelvic: External genitalia:  no lesions              Urethra:  normal appearing urethra with no masses, tenderness or lesions              Bartholins and Skenes: normal                 Vagina: normal appearing vagina with normal color and discharge, no lesions              Cervix: no lesions                Bimanual Exam:  Uterus:  normal size, contour, position, consistency, mobility, non-tender              Adnexa: no mass, fullness, tenderness              Rectal exam: {yes no:[id number]}.  Confirms.              Anus:  normal sphincter tone, no lesions  Chaperone was present for exam.  ASSESSMENT     PLAN     An After Visit Summary was printed and given to the patient.  ______ minutes face to face time of which over 50% was spent in counseling.

## 2018-06-19 ENCOUNTER — Ambulatory Visit: Payer: BLUE CROSS/BLUE SHIELD | Admitting: Obstetrics and Gynecology

## 2018-07-08 ENCOUNTER — Ambulatory Visit: Payer: BLUE CROSS/BLUE SHIELD

## 2018-07-10 ENCOUNTER — Other Ambulatory Visit: Payer: Self-pay

## 2018-07-10 ENCOUNTER — Ambulatory Visit: Payer: BLUE CROSS/BLUE SHIELD | Admitting: Obstetrics and Gynecology

## 2018-07-10 ENCOUNTER — Encounter: Payer: Self-pay | Admitting: Obstetrics and Gynecology

## 2018-07-10 VITALS — BP 104/62 | HR 84 | Ht 60.5 in | Wt 218.8 lb

## 2018-07-10 DIAGNOSIS — N946 Dysmenorrhea, unspecified: Secondary | ICD-10-CM | POA: Diagnosis not present

## 2018-07-10 DIAGNOSIS — G479 Sleep disorder, unspecified: Secondary | ICD-10-CM

## 2018-07-10 DIAGNOSIS — N920 Excessive and frequent menstruation with regular cycle: Secondary | ICD-10-CM | POA: Diagnosis not present

## 2018-07-10 DIAGNOSIS — R7989 Other specified abnormal findings of blood chemistry: Secondary | ICD-10-CM | POA: Diagnosis not present

## 2018-07-10 NOTE — Progress Notes (Signed)
[facility]rena[facility]OLOGY  VISIT   HPI: 28 y.o.   Single  African American  female   [place] with Patient's last menstrual period was 06/27/2018 (exact date).   here for follow up. Patient had to stop Micronor due to mood swings and leg pain.  Has uterine enlargement with fibroids and dysmenorrhea.  Her largest fibroid is 3 cm.   Is in bed for 2 days due to pain during her menses.   Bleeding is increased.  Pad change 6 times a day.   She had mood swings with taking the Micronor.  Feeling stress at work.  Doing art and music for stress reduction.  Has had counseling.   She had imaging of her right leg showing possible herniation of anterior tibialis muscle or chronic superficial vein thrombus. Her legs felt really heavy with use of the Micronor and they are somewhat back to normal now but not completely so.  Memory issues.  Not sleeping well due to feeling hot and cold. Snores.   Neg GC/CT on 03/07/18.  GYNECOLOGIC HISTORY: Patient's last menstrual period was 06/27/2018 (exact date). Contraception: condoms Menopausal hormone therapy:  n/a Last mammogram:  n/a Last pap smear:  03-07-18 Neg:neg HR HPV        OB History    Gravida  0   Para  0   Term  0   Preterm  0   AB  0   Living  0     SAB  0   TAB  0   Ectopic  0   Multiple  0   Live Births  0              Patient Active Problem List   Diagnosis Date Noted  . Leg mass, bilateral 04/19/2018  . Mild persistent asthma 04/19/2018  . Eczema 04/19/2018  . Elevated serum creatinine 04/19/2018    Past Medical History:  Diagnosis Date  . Abnormal Pap smear of cervix 2009  . Anxiety   . Asthma   . Depression   . Elevated serum creatinine   . Fibroids   . H/O chlamydia infection 07/2012  . UTI (lower urinary tract infection)     History reviewed. No pertinent surgical history.  Current Outpatient Medications  Medication Sig Dispense Refill  . triamcinolone cream (KENALOG) 0.1 % Apply 1 application  topically 2 (two) times daily. 80 g 1  . beclomethasone (QVAR REDIHALER) 80 MCG/ACT inhaler Inhale 1 puff into the lungs 2 (two) times daily. (Patient not taking: Reported on 07/10/2018) 10.6 g 3   No current facility-administered medications for this visit.      ALLERGIES: Pineapple; Albuterol; Caffeine; and Latex  Family History  Problem Relation Age of Onset  . Anxiety disorder Father   . Diabetes Father   . Heart disease Father   . Arthritis Father   . Hypothyroidism Paternal Aunt   . Heart disease Maternal Grandfather   . Hypothyroidism Sister     Social History   Socioeconomic History  . Marital status: Single    Spouse name: Not on file  . Number of children: Not on file  . Years of education: Not on file  . Highest education level: Not on file  Occupational History  . Not on file  Social Needs  . Financial resource strain: Not on file  . Food insecurity:    Worry: Not on file    Inability: Not on file  . Transportation needs:    Medical: Not on file  Non-medical: Not on file  Tobacco Use  . Smoking status: Never Smoker  . Smokeless tobacco: Never Used  Substance and Sexual Activity  . Alcohol use: No  . Drug use: No  . Sexual activity: Yes    Birth control/protection: Condom  Lifestyle  . Physical activity:    Days per week: Not on file    Minutes per session: Not on file  . Stress: Not on file  Relationships  . Social connections:    Talks on phone: Not on file    Gets together: Not on file    Attends religious service: Not on file    Active member of club or organization: Not on file    Attends meetings of clubs or organizations: Not on file    Relationship status: Not on file  . Intimate partner violence:    Fear of current or ex partner: Not on file    Emotionally abused: Not on file    Physically abused: Not on file    Forced sexual activity: Not on file  Other Topics Concern  . Not on file  Social History Narrative  . Not on file     Review of Systems  Neurological:       Memory loss  All other systems reviewed and are negative.   PHYSICAL EXAMINATION:    BP 104/62 (BP Location: Right Arm, Patient Position: Sitting, Cuff Size: Large)   Pulse 84   Ht 5' 0.5" (1.537 m)   Wt 218 lb 12.8 oz (99.2 kg)   LMP 06/27/2018 (Exact Date)   BMI 42.03 kg/m     General appearance: alert, cooperative and appears stated age   ASSESSMENT  Menorrhagia and dysmenorrhea.  Intolerance to Micronor. Fibroids.  Stress.  Not sleeping well.  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  Hx elevated creatinine.   PLAN  We discussed options for care for her painful and heavy menses.  Mirena IUD risks and benefits reviewed.   Will precert this.   Plan for paracervical block and Cytotec 200 mcg the night prior to insertion and then the am of insertion.  Check CBC and BMP today.  We talked about increasing physical activity.  If she continues to have memory and sleep issues, she may need further evaluation with neurology including a sleep study to rule out sleep apnea.   An After Visit Summary was printed and given to the patient.  __25____ minutes face to face time of which over 50% was spent in counseling.

## 2018-07-11 LAB — BASIC METABOLIC PANEL
BUN / CREAT RATIO: 15 (ref 9–23)
BUN: 15 mg/dL (ref 6–20)
CHLORIDE: 103 mmol/L (ref 96–106)
CO2: 23 mmol/L (ref 20–29)
CREATININE: 0.99 mg/dL (ref 0.57–1.00)
Calcium: 9.6 mg/dL (ref 8.7–10.2)
GFR calc Af Amer: 90 mL/min/{1.73_m2} (ref >59)
GFR calc non Af Amer: 78 mL/min/{1.73_m2} (ref >59)
GLUCOSE: 88 mg/dL (ref 65–99)
Potassium: 4.4 mmol/L (ref 3.5–5.2)
SODIUM: 141 mmol/L (ref 134–144)

## 2018-07-11 LAB — CBC
Hematocrit: 40.3 % (ref 34.0–46.6)
Hemoglobin: 13.6 g/dL (ref 11.1–15.9)
MCH: 32 pg (ref 26.6–33.0)
MCHC: 33.7 g/dL (ref 31.5–35.7)
MCV: 95 fL (ref 79–97)
PLATELETS: 197 10*3/uL (ref 150–450)
RBC: 4.25 x10E6/uL (ref 3.77–5.28)
RDW: 12.6 % (ref 11.7–15.4)
WBC: 5.7 10*3/uL (ref 3.4–10.8)

## 2018-08-10 ENCOUNTER — Emergency Department (HOSPITAL_COMMUNITY)
Admission: EM | Admit: 2018-08-10 | Discharge: 2018-08-11 | Disposition: A | Discharge status: Home / Self Care | Payer: BLUE CROSS/BLUE SHIELD | Attending: Emergency Medicine | Admitting: Emergency Medicine

## 2018-08-10 ENCOUNTER — Encounter (HOSPITAL_COMMUNITY): Payer: Self-pay | Admitting: Emergency Medicine

## 2018-08-10 ENCOUNTER — Emergency Department (HOSPITAL_COMMUNITY): Payer: BLUE CROSS/BLUE SHIELD

## 2018-08-10 ENCOUNTER — Other Ambulatory Visit: Payer: Self-pay

## 2018-08-10 DIAGNOSIS — J45909 Unspecified asthma, uncomplicated: Secondary | ICD-10-CM | POA: Insufficient documentation

## 2018-08-10 DIAGNOSIS — Z91040 Latex allergy status: Secondary | ICD-10-CM | POA: Diagnosis not present

## 2018-08-10 DIAGNOSIS — R51 Headache: Secondary | ICD-10-CM | POA: Diagnosis not present

## 2018-08-10 DIAGNOSIS — R519 Headache, unspecified: Secondary | ICD-10-CM

## 2018-08-10 MED ORDER — SODIUM CHLORIDE 0.9 % IV BOLUS
500.0000 mL | Freq: Once | INTRAVENOUS | Status: AC
  Administered 2018-08-10: 500 mL via INTRAVENOUS

## 2018-08-10 MED ORDER — METOCLOPRAMIDE HCL 5 MG/ML IJ SOLN
10.0000 mg | Freq: Once | INTRAMUSCULAR | Status: AC
  Administered 2018-08-10: 10 mg via INTRAVENOUS
  Filled 2018-08-10: qty 2

## 2018-08-10 NOTE — ED Provider Notes (Signed)
[facility] DEPT Provider Note   CSN: [id number] Arrival date & time: 08/10/18  2226    History   Chief Complaint Chief Complaint  Patient presents with  . Headache    HPI Shelby Jackson is a 28 y.o. female.    28 year old female presents to the emergency department for headache which she describes as the worst headache of her life.  It began acutely approximately 1 hour ago.  She states that she was having a bowel movement when she began to notice a pain on the left side of her neck which radiated up towards her ear and around to her left parietal scalp.  Headache has now become more global.  She has had associated photophobia and phonophobia as well as intermittent blurry vision.  She has not taken any medications for her symptoms.  No vomiting, tinnitus, hearing loss, vision loss, difficulty speaking or swallowing, extremity numbness or paresthesias, extremity weakness.  She does report a history of migraines, but states that it has been many years since she has experienced one.  She feels this pain is different.  The history is provided by the patient. No language interpreter was used.  Headache    Past Medical History:  Diagnosis Date  . Abnormal Pap smear of cervix 2009  . Anxiety   . Asthma   . Depression   . Elevated serum creatinine   . Fibroids   . H/O chlamydia infection 07/2012  . UTI (lower urinary tract infection)     Patient Active Problem List   Diagnosis Date Noted  . Leg mass, bilateral 04/19/2018  . Mild persistent asthma 04/19/2018  . Eczema 04/19/2018  . Elevated serum creatinine 04/19/2018    History reviewed. No pertinent surgical history.   OB History    Gravida  0   Para  0   Term  0   Preterm  0   AB  0   Living  0     SAB  0   TAB  0   Ectopic  0   Multiple  0   Live Births  0            Home Medications    Prior to Admission medications   Medication Sig Start Date End Date  Taking? Authorizing Provider  beclomethasone (QVAR REDIHALER) 80 MCG/ACT inhaler Inhale 1 puff into the lungs 2 (two) times daily. Patient not taking: Reported on 07/10/2018 04/19/18   Vivi Barrack, MD  triamcinolone cream (KENALOG) 0.1 % Apply 1 application topically 2 (two) times daily. 04/19/18   Vivi Barrack, MD    Family History Family History  Problem Relation Age of Onset  . Anxiety disorder Father   . Diabetes Father   . Heart disease Father   . Arthritis Father   . Hypothyroidism Paternal Aunt   . Heart disease Maternal Grandfather   . Hypothyroidism Sister     Social History Social History   Tobacco Use  . Smoking status: Never Smoker  . Smokeless tobacco: Never Used  Substance Use Topics  . Alcohol use: No  . Drug use: No     Allergies   Pineapple; Albuterol; Caffeine; and Latex   Review of Systems Review of Systems  Neurological: Positive for headaches.  Ten systems reviewed and are negative for acute change, except as noted in the HPI.    Physical Exam Updated Vital Signs BP (!) 139/96 (BP Location: Right Arm)   Pulse 93  Temp 98.8 F (37.1 C) (Oral)   Resp 18   Ht 5' (1.524 m)   Wt 99.8 kg   LMP 07/24/2018   SpO2 99%   BMI 42.97 kg/m   Physical Exam Vitals signs and nursing note reviewed.  Constitutional:      General: She is not in acute distress.    Appearance: She is well-developed. She is not diaphoretic.     Comments: Nontoxic appearing and in NAD  HENT:     Head: Normocephalic and atraumatic.     Right Ear: Tympanic membrane, ear canal and external ear normal.     Left Ear: Tympanic membrane, ear canal and external ear normal.     Ears:     Comments: No hemotympanum bilaterally    Mouth/Throat:     Mouth: Mucous membranes are moist.     Comments: Symmetric rise of the uvula with phonation. Eyes:     General: No scleral icterus.    Extraocular Movements: Extraocular movements intact.     Conjunctiva/sclera: Conjunctivae  normal.     Pupils: Pupils are equal, round, and reactive to light.  Neck:     Musculoskeletal: Normal range of motion.     Comments: No meningismus Pulmonary:     Effort: Pulmonary effort is normal. No respiratory distress.     Comments: Respirations even and unlabored Musculoskeletal: Normal range of motion.  Skin:    General: Skin is warm and Jackson.     Coloration: Skin is not pale.     Findings: No erythema or rash.  Neurological:     Mental Status: She is alert and oriented to person, place, and time.     Comments: GCS 15. Speech is goal oriented. No cranial nerve deficits appreciated; symmetric eyebrow raise, no facial drooping, tongue midline. Patient has equal grip strength bilaterally with 5/5 strength against resistance in all major muscle groups bilaterally. Sensation to light touch intact. Patient moves extremities without ataxia.  Psychiatric:        Behavior: Behavior normal.      ED Treatments / Results  Labs (all labs ordered are listed, but only abnormal results are displayed) Labs Reviewed - No data to display  EKG None  Radiology Ct Head Wo Contrast  Result Date: 08/10/2018 CLINICAL DATA:  28 year old female with severe headache. EXAM: CT HEAD WITHOUT CONTRAST TECHNIQUE: Contiguous axial images were obtained from the base of the skull through the vertex without intravenous contrast. COMPARISON:  None. FINDINGS: Brain: No evidence of acute infarction, hemorrhage, hydrocephalus, extra-axial collection or mass lesion/mass effect. Vascular: No hyperdense vessel or unexpected calcification. Skull: Normal. Negative for fracture or focal lesion. Sinuses/Orbits: No acute finding. Other: None IMPRESSION: Normal unenhanced CT of the brain. Electronically Signed   By: Anner Crete M.D.   On: 08/10/2018 23:54    Procedures Procedures (including critical care time)  Medications Ordered in ED Medications  metoCLOPramide (REGLAN) injection 10 mg (10 mg Intravenous Given  08/10/18 2327)  sodium chloride 0.9 % bolus 500 mL (0 mLs Intravenous Stopped 08/10/18 2359)  ketorolac (TORADOL) 30 MG/ML injection 30 mg (30 mg Intravenous Given 08/11/18 0028)    12:27 AM Head CT shows no evidence of hemorrhage, hydrocephalus, mass/lesion.  Given that imaging was obtained within 6 hours of symptom onset, doubt subarachnoid hemorrhage.  Patient with improvement in pain from 7/10 down to 5/10. Receiving Toradol. If further improvement to headache, stable for discharge and outpatient f/u.  1:14 AM Pain at 0/10. Patient comfortable for discharge.  Initial Impression / Assessment and Plan / ED Course  I have reviewed the triage vital signs and the nursing notes.  Pertinent labs & imaging results that were available during my care of the patient were reviewed by me and considered in my medical decision making (see chart for details).        Patient presents to the emergency department for evaluation of headache which began 1 hour PTA.  Patient with no history of recent head injury or trauma.  No fever, nuchal rigidity, meningismus to suggest meningitis.  Neurologic exam today is nonfocal.  On reassessment, the patient has had significant improvement in headache symptoms following a migraine cocktail.  I do not believe further emergent workup is indicated at this time.  Return precautions discussed and provided.  Patient discharged in stable condition with no unaddressed concerns.   Final Clinical Impressions(s) / ED Diagnoses   Final diagnoses:  Bad headache    ED Discharge Orders    None       Antonietta Breach, PA-C 08/11/18 0115    Lacretia Leigh, MD 08/11/18 1626

## 2018-08-10 NOTE — ED Notes (Signed)
Bed: WA03 Expected date:  Expected time:  Means of arrival:  Comments: 

## 2018-08-10 NOTE — ED Triage Notes (Addendum)
Patient is complaining of a headache. Patient has had a headache for about 30-45 min. Patient did not take anything for the pain.

## 2018-08-10 NOTE — ED Notes (Signed)
Patient transported to CT 

## 2018-08-11 MED ORDER — KETOROLAC TROMETHAMINE 30 MG/ML IJ SOLN
30.0000 mg | Freq: Once | INTRAMUSCULAR | Status: AC | PRN
  Administered 2018-08-11: 30 mg via INTRAVENOUS
  Filled 2018-08-11: qty 1

## 2018-08-11 NOTE — Discharge Instructions (Signed)
Your CT scan did not show any concerning process causing your headache.  We recommend over-the-counter naproxen or Excedrin Migraine for persistent discomfort.  Follow-up with a primary doctor.

## 2018-08-13 ENCOUNTER — Encounter: Payer: Self-pay | Admitting: Family Medicine

## 2018-08-13 ENCOUNTER — Ambulatory Visit: Payer: BLUE CROSS/BLUE SHIELD | Admitting: Family Medicine

## 2018-08-13 VITALS — BP 118/76 | HR 85 | Temp 98.9°F | Ht 60.0 in | Wt 224.4 lb

## 2018-08-13 DIAGNOSIS — M79604 Pain in right leg: Secondary | ICD-10-CM | POA: Diagnosis not present

## 2018-08-13 DIAGNOSIS — M79605 Pain in left leg: Secondary | ICD-10-CM

## 2018-08-13 DIAGNOSIS — R6889 Other general symptoms and signs: Secondary | ICD-10-CM | POA: Diagnosis not present

## 2018-08-13 DIAGNOSIS — J329 Chronic sinusitis, unspecified: Secondary | ICD-10-CM

## 2018-08-13 LAB — POCT INFLUENZA A/B
INFLUENZA A, POC: NEGATIVE
INFLUENZA B, POC: NEGATIVE

## 2018-08-13 MED ORDER — IPRATROPIUM BROMIDE 0.06 % NA SOLN: 2.0000 | Freq: Four times a day (QID) | NASAL | 0 refills | Status: DC

## 2018-08-13 MED ORDER — AMOXICILLIN-POT CLAVULANATE 875-125 MG PO TABS: 1.0000 | ORAL_TABLET | Freq: Two times a day (BID) | ORAL | 0 refills | Status: DC

## 2018-08-13 NOTE — Progress Notes (Signed)
   Chief Complaint:  Shelby Jackson is a 28 y.o. female who presents for same day appointment with a chief complaint of sinus congestion.   Assessment/Plan:  Sinusitis Likely secondary to viral URI. No signs of bacterial infection. Start atrovent for rhinorrhea/sinus congestion. Sent in a "pocket prescription" for augmentin with strict instruction to not start unless symptoms worsen or fail to improve within the next several days. Recommended tylenol and/or motrin as needed for low grade fever and pain. Encouraged good oral hydration. Return precautions reviewed. Follow up as needed.   Bilateral leg pain Unclear etiology.  She did have questionable herniation of anterior tibialis through the superficial fascia.  Will place referral to sports medicine for further evaluation.  May ultimately need advanced imaging such as MRI.    Subjective:  HPI:  Sinus Congestion, acute problem Started about 3 days ago. Stable over that time. Associated with bilateral ear pain, fever, and headache. No cough. Some sneezing. Some sick contacts at work. Tried taking alka seltzer and advil which have helped. No other obvious alleviating or aggravating factors.   Bilateral leg pain, established problem, stable Patient seen about 4 months ago for bilateral lower extremity pain.  She was noted to have nodules on the anterior aspect of her shins bilaterally.  Ultrasound was obtained which showed possible anterior tibialis herniation versus superficial vein thrombosis versus fat necrosis.  She has worsening pain when she walks.  Symptoms are not improving.  ROS: Per HPI  PMH: She reports that she has never smoked. She has never used smokeless tobacco. She reports that she does not drink alcohol or use drugs.      Objective:  Physical Exam: BP 118/76 (BP Location: Left Arm, Patient Position: Sitting, Cuff Size: Large)   Pulse 85   Temp 98.9 F (37.2 C) (Oral)   Ht 5' (1.524 m)   Wt 224 lb 6.1 oz (101.8  kg)   LMP 07/24/2018   SpO2 97%   BMI 43.82 kg/m   Gen: NAD, resting comfortably HEENT: TMs erythematous with effusion bilaterally.  OP erythematous.  Nose mucosa erythematous and boggy bilaterally. CV: Regular rate and rhythm with no murmurs appreciated Pulm: Normal work of breathing, clear to auscultation bilaterally with no crackles, wheezes, or rhonchi MSK: Several discrete nodular lesions on anterior lateral aspects of shins bilaterally.  Pain worsened with dorsiflexion. Skin: Warm, dry Neuro: Grossly normal, moves all extremities      Lucus Lambertson M. Jerline Pain, MD 08/13/2018 11:09 AM

## 2018-08-13 NOTE — Patient Instructions (Signed)
Start the atrovent.  Start the augmentin if your symptoms worsen or do not improve in a few days.  Please stay well hydrated.  You can take tylenol and/or motrin as needed for low grade fever and pain.  Please let me know if your symptoms worsen or fail to improve.  Please come back to see Dr Paulla Fore soon for your leg pain.   Take care, Dr Jerline Pain

## 2018-08-20 ENCOUNTER — Encounter: Payer: Self-pay | Admitting: Family Medicine

## 2018-08-20 ENCOUNTER — Ambulatory Visit: Payer: BLUE CROSS/BLUE SHIELD | Admitting: Physician Assistant

## 2018-08-20 ENCOUNTER — Encounter: Payer: Self-pay | Admitting: Physician Assistant

## 2018-08-20 VITALS — BP 120/80 | HR 81 | Temp 98.7°F | Ht 60.0 in | Wt 222.4 lb

## 2018-08-20 DIAGNOSIS — R6889 Other general symptoms and signs: Secondary | ICD-10-CM | POA: Diagnosis not present

## 2018-08-20 LAB — POC INFLUENZA A&B (BINAX/QUICKVUE)
INFLUENZA B, POC: NEGATIVE
Influenza A, POC: NEGATIVE

## 2018-08-20 LAB — POCT URINE PREGNANCY: Preg Test, Ur: NEGATIVE

## 2018-08-20 LAB — POCT RAPID STREP A (OFFICE): Rapid Strep A Screen: NEGATIVE

## 2018-08-20 NOTE — Progress Notes (Signed)
Shelby Jackson is a 28 y.o. female here for a new problem.  I acted as a Education administrator for [employer], PA-C Shelby Pickler, LPN  History of Present Illness:   Chief Complaint  Patient presents with  . Flu like symptoms    HPI  Flu like symptoms Pt started yesterday with headache yesterday, chills, body aches, fatigue, nausea no vomiting, having slight SOB and some dizziness. No cough, some nasal congestion was clear few days ago but now green drainage again. Pt was Dx with  sinus infection on 3/3 with Dr. Jerline Jackson was given Rx for Augmentin but told to try nasal spray first and see how she feels. She has not started the Augmentin yet. She has had worsening ST over the last night.  Patient's last menstrual period was 07/24/2018. She took two pregnancy tests last week that were negative.    Past Medical History:  Diagnosis Date  . Abnormal Pap smear of cervix 2009  . Anxiety   . Asthma   . Depression   . Elevated serum creatinine   . Fibroids   . H/O chlamydia infection 07/2012  . UTI (lower urinary tract infection)      Social History   Socioeconomic History  . Marital status: Single    Spouse name: Not on file  . Number of children: Not on file  . Years of education: Not on file  . Highest education level: Not on file  Occupational History  . Not on file  Social Needs  . Financial resource strain: Not on file  . Food insecurity:    Worry: Not on file    Inability: Not on file  . Transportation needs:    Medical: Not on file    Non-medical: Not on file  Tobacco Use  . Smoking status: Never Smoker  . Smokeless tobacco: Never Used  Substance and Sexual Activity  . Alcohol use: No  . Drug use: No  . Sexual activity: Yes    Birth control/protection: Condom  Lifestyle  . Physical activity:    Days per week: Not on file    Minutes per session: Not on file  . Stress: Not on file  Relationships  . Social connections:    Talks on phone: Not on file    Gets  together: Not on file    Attends religious service: Not on file    Active member of club or organization: Not on file    Attends meetings of clubs or organizations: Not on file    Relationship status: Not on file  . Intimate partner violence:    Fear of current or ex partner: Not on file    Emotionally abused: Not on file    Physically abused: Not on file    Forced sexual activity: Not on file  Other Topics Concern  . Not on file  Social History Narrative  . Not on file    History reviewed. No pertinent surgical history.  Family History  Problem Relation Age of Onset  . Anxiety disorder Father   . Diabetes Father   . Heart disease Father   . Arthritis Father   . Hypothyroidism Paternal Aunt   . Heart disease Maternal Grandfather   . Hypothyroidism Sister     Allergies  Allergen Reactions  . Pineapple   . Albuterol     Anaphylaxis.   . Caffeine     Veins popped out of head  . Latex Rash    Current Medications:  Current Outpatient Medications:  .  ipratropium (ATROVENT) 0.06 % nasal spray, Place 2 sprays into both nostrils 4 (four) times daily., Disp: 15 mL, Rfl: 0 .  triamcinolone cream (KENALOG) 0.1 %, Apply 1 application topically 2 (two) times daily., Disp: 80 g, Rfl: 1 .  amoxicillin-clavulanate (AUGMENTIN) 875-125 MG tablet, Take 1 tablet by mouth 2 (two) times daily. (Patient not taking: Reported on 08/20/2018), Disp: 14 tablet, Rfl: 0   Review of Systems:   Review of Systems  Constitutional: Negative for chills, fever, malaise/fatigue and weight loss.  HENT: Positive for congestion, ear Jackson and sore throat.   Respiratory: Positive for cough. Negative for sputum production and wheezing.   Cardiovascular: Negative for chest Jackson, orthopnea, claudication and leg swelling.  Gastrointestinal: Negative for heartburn, nausea and vomiting.  Musculoskeletal: Negative for myalgias and neck Jackson.  Neurological: Negative for dizziness, tingling and headaches.     Vitals:   Vitals:   08/20/18 1444  BP: 120/80  Pulse: 81  Temp: 98.7 F (37.1 C)  TempSrc: Oral  SpO2: 96%  Weight: 222 lb 6.1 oz (100.9 kg)  Height: 5' (1.524 m)     Body mass index is 43.43 kg/m.  Physical Exam:   Physical Exam Vitals signs and nursing note reviewed.  Constitutional:      General: She is not in acute distress.    Appearance: She is well-developed. She is not ill-appearing or toxic-appearing.  HENT:     Head: Normocephalic and atraumatic.     Right Ear: Tympanic membrane, ear canal and external ear normal. Tympanic membrane is not erythematous, retracted or bulging.     Left Ear: Tympanic membrane, ear canal and external ear normal. Tympanic membrane is not erythematous, retracted or bulging.     Nose: Mucosal edema, congestion and rhinorrhea present.     Right Sinus: Frontal sinus tenderness present. No maxillary sinus tenderness.     Left Sinus: Frontal sinus tenderness present. No maxillary sinus tenderness.     Mouth/Throat:     Lips: Pink.     Mouth: Mucous membranes are moist.     Pharynx: Uvula midline. Posterior oropharyngeal erythema present.  Eyes:     General: Lids are normal.     Conjunctiva/sclera: Conjunctivae normal.  Neck:     Trachea: Trachea normal.  Cardiovascular:     Rate and Rhythm: Normal rate and regular rhythm.     Heart sounds: Normal heart sounds, S1 normal and S2 normal.  Pulmonary:     Effort: Pulmonary effort is normal.     Breath sounds: Normal breath sounds. No decreased breath sounds, wheezing, rhonchi or rales.  Lymphadenopathy:     Cervical: No cervical adenopathy.  Skin:    General: Skin is warm and dry.  Neurological:     Mental Status: She is alert.  Psychiatric:        Speech: Speech normal.        Behavior: Behavior normal. Behavior is cooperative.     Results for orders placed or performed in visit on 08/20/18  POC Influenza A&B(BINAX/QUICKVUE)  Result Value Ref Range   Influenza A, POC  Negative Negative   Influenza B, POC Negative Negative  POCT urine pregnancy  Result Value Ref Range   Preg Test, Ur Negative Negative  POCT rapid strep A  Result Value Ref Range   Rapid Strep A Screen Negative Negative    Assessment and Plan:   Shelby Jackson was seen today for flu like symptoms.  Diagnoses and all  orders for this visit:  Flu-like symptoms -     POC Influenza A&B(BINAX/QUICKVUE) -     POCT urine pregnancy -     POCT rapid strep A   No red flags on exam.  Flu and strep negative. Recommended that she the antibiotic that [facility] sent in for her, Augmentin for presumed sinusitis. Discussed taking medications as prescribed. Reviewed return precautions including worsening fever, SOB, worsening cough or other concerns. Push fluids and rest. I recommend that patient follow-up if symptoms worsen or persist despite treatment x 7-10 days, sooner if needed.  . Reviewed expectations re: course of current medical issues. . Discussed self-management of symptoms. . Outlined signs and symptoms indicating need for more acute intervention. . Patient verbalized understanding and all questions were answered. . See orders for this visit as documented in the electronic medical record. . Patient received an After-Visit Summary.  CMA or LPN served as scribe during this visit. History, Physical, and Plan performed by medical provider. The above documentation has been reviewed and is accurate and complete.  Inda Coke, PA-C

## 2018-08-20 NOTE — Patient Instructions (Signed)
It was great to see you!  Start the augmentin antibiotic Dr. Jerline Pain sent in for you.  Push fluids and get plenty of rest. Please return if you are not improving as expected, or if you have high fevers (>101.5) or difficulty swallowing or worsening productive cough.  Call clinic with questions.  I hope you start feeling better soon!

## 2018-08-27 ENCOUNTER — Ambulatory Visit: Payer: BLUE CROSS/BLUE SHIELD | Admitting: Sports Medicine

## 2018-09-16 ENCOUNTER — Encounter: Payer: Self-pay | Admitting: Family Medicine

## 2018-09-17 ENCOUNTER — Other Ambulatory Visit: Payer: Self-pay

## 2018-09-17 ENCOUNTER — Ambulatory Visit: Payer: Self-pay

## 2018-09-17 ENCOUNTER — Encounter: Payer: Self-pay | Admitting: Sports Medicine

## 2018-09-17 ENCOUNTER — Ambulatory Visit (INDEPENDENT_AMBULATORY_CARE_PROVIDER_SITE_OTHER): Payer: BLUE CROSS/BLUE SHIELD | Admitting: Sports Medicine

## 2018-09-17 ENCOUNTER — Emergency Department (HOSPITAL_COMMUNITY)
Admission: EM | Admit: 2018-09-17 | Discharge: 2018-09-17 | Disposition: A | Discharge status: Home / Self Care | Payer: BLUE CROSS/BLUE SHIELD | Attending: Emergency Medicine | Admitting: Emergency Medicine

## 2018-09-17 ENCOUNTER — Encounter (HOSPITAL_COMMUNITY): Payer: Self-pay | Admitting: Emergency Medicine

## 2018-09-17 VITALS — Temp 98.5°F | Ht 60.0 in | Wt 220.0 lb

## 2018-09-17 DIAGNOSIS — R51 Headache: Secondary | ICD-10-CM | POA: Insufficient documentation

## 2018-09-17 DIAGNOSIS — J4521 Mild intermittent asthma with (acute) exacerbation: Secondary | ICD-10-CM | POA: Diagnosis not present

## 2018-09-17 DIAGNOSIS — I82813 Embolism and thrombosis of superficial veins of lower extremities, bilateral: Secondary | ICD-10-CM | POA: Diagnosis not present

## 2018-09-17 DIAGNOSIS — Z91040 Latex allergy status: Secondary | ICD-10-CM | POA: Diagnosis not present

## 2018-09-17 DIAGNOSIS — I872 Venous insufficiency (chronic) (peripheral): Secondary | ICD-10-CM

## 2018-09-17 DIAGNOSIS — M79604 Pain in right leg: Secondary | ICD-10-CM

## 2018-09-17 DIAGNOSIS — Z79899 Other long term (current) drug therapy: Secondary | ICD-10-CM | POA: Insufficient documentation

## 2018-09-17 DIAGNOSIS — I878 Other specified disorders of veins: Secondary | ICD-10-CM | POA: Diagnosis not present

## 2018-09-17 DIAGNOSIS — R0602 Shortness of breath: Secondary | ICD-10-CM | POA: Diagnosis present

## 2018-09-17 DIAGNOSIS — R0981 Nasal congestion: Secondary | ICD-10-CM | POA: Diagnosis not present

## 2018-09-17 DIAGNOSIS — M79605 Pain in left leg: Secondary | ICD-10-CM

## 2018-09-17 MED ORDER — IPRATROPIUM BROMIDE 0.06 % NA SOLN: 2.0000 | Freq: Four times a day (QID) | NASAL | 0 refills | Status: DC

## 2018-09-17 MED ORDER — PREDNISONE 10 MG (21) PO TBPK: ORAL_TABLET | Freq: Every day | ORAL | 0 refills | Status: DC

## 2018-09-17 NOTE — Patient Instructions (Addendum)
It was great to meet you.  Try wearing the compression socks daily.  Is best to put these on first thing in the morning before your feet hit the floor.  When you get home at night try elevating your legs up above the level of your heart.  You need to look for compression socks that are rated between 20 and 30 mmHg.  The to medical supply stores to do a good job of fitting are [employer] and [employer].

## 2018-09-17 NOTE — Telephone Encounter (Signed)
[facility] transferred patient,stated having migraine,chest congestion,dizziness,lightheadedness,nausea..Also chest pain with a burning sensation and SOB x 4 days.Per Dr.Parker patient needs to go to ER.Notified patient to go to ER and call ahead of time,voices understanding.

## 2018-09-17 NOTE — ED Triage Notes (Signed)
Patient c/o intermittent SOB and headache with chest pain upon inspiration x4 days. Denies cough and fever.

## 2018-09-17 NOTE — ED Provider Notes (Signed)
[facility] DEPT Provider Note   CSN: [id number] Arrival date & time: 09/17/18  1539    History   Chief Complaint Chief Complaint  Patient presents with  . Shortness of Breath    HPI Shelby Jackson is a 28 y.o. female.     28 year old female presents with intermittent shortness of breath and mild headache with associated nasal congestion.  No fever or chills.  Slight cough.  History of asthma and she states that this feels similar.  Denies any anginal or CHF symptoms.  No vomiting or diarrhea.  Patient has an allergy to albuterol and was prescribed inhaler in the past but she has not been taking one.     Past Medical History:  Diagnosis Date  . Abnormal Pap smear of cervix 2009  . Anxiety   . Asthma   . Depression   . Elevated serum creatinine   . Fibroids   . H/O chlamydia infection 07/2012  . UTI (lower urinary tract infection)     Patient Active Problem List   Diagnosis Date Noted  . Leg mass, bilateral 04/19/2018  . Mild persistent asthma 04/19/2018  . Eczema 04/19/2018  . Elevated serum creatinine 04/19/2018    History reviewed. No pertinent surgical history.   OB History    Gravida  0   Para  0   Term  0   Preterm  0   AB  0   Living  0     SAB  0   TAB  0   Ectopic  0   Multiple  0   Live Births  0            Home Medications    Prior to Admission medications   Medication Sig Start Date End Date Taking? Authorizing Provider  ipratropium (ATROVENT) 0.06 % nasal spray Place 2 sprays into both nostrils 4 (four) times daily. 08/13/18   Vivi Barrack, MD  triamcinolone cream (KENALOG) 0.1 % Apply 1 application topically 2 (two) times daily. 04/19/18   Vivi Barrack, MD    Family History Family History  Problem Relation Age of Onset  . Anxiety disorder Father   . Diabetes Father   . Heart disease Father   . Arthritis Father   . Hypothyroidism Paternal Aunt   . Heart disease Maternal  Grandfather   . Hypothyroidism Sister     Social History Social History   Tobacco Use  . Smoking status: Never Smoker  . Smokeless tobacco: Never Used  Substance Use Topics  . Alcohol use: No  . Drug use: No     Allergies   Pineapple; Albuterol; Caffeine; and Latex   Review of Systems Review of Systems  All other systems reviewed and are negative.    Physical Exam Updated Vital Signs BP 136/84 (BP Location: Left Arm)   Pulse 91   Temp 99.4 F (37.4 C) (Oral)   Resp 15   Ht 1.524 m (5')   Wt 99.8 kg   LMP 07/24/2018   SpO2 99%   BMI 42.97 kg/m   Physical Exam Vitals signs and nursing note reviewed.  Constitutional:      General: She is not in acute distress.    Appearance: Normal appearance. She is well-developed. She is not toxic-appearing.  HENT:     Head: Normocephalic and atraumatic.  Eyes:     General: Lids are normal.     Conjunctiva/sclera: Conjunctivae normal.     Pupils: Pupils are  equal, round, and reactive to light.  Neck:     Musculoskeletal: Normal range of motion and neck supple.     Thyroid: No thyroid mass.     Trachea: No tracheal deviation.  Cardiovascular:     Rate and Rhythm: Normal rate and regular rhythm.     Heart sounds: Normal heart sounds. No murmur. No gallop.   Pulmonary:     Effort: Pulmonary effort is normal. No prolonged expiration or respiratory distress.     Breath sounds: Normal breath sounds. No stridor. No decreased breath sounds, wheezing, rhonchi or rales.  Abdominal:     General: Bowel sounds are normal. There is no distension.     Palpations: Abdomen is soft.     Tenderness: There is no abdominal tenderness. There is no rebound.  Musculoskeletal: Normal range of motion.        General: No tenderness.  Skin:    General: Skin is warm and dry.     Findings: No abrasion or rash.  Neurological:     Mental Status: She is alert and oriented to person, place, and time.     GCS: GCS eye subscore is 4. GCS verbal  subscore is 5. GCS motor subscore is 6.     Cranial Nerves: No cranial nerve deficit.     Sensory: No sensory deficit.  Psychiatric:        Speech: Speech normal.        Behavior: Behavior normal.      ED Treatments / Results  Labs (all labs ordered are listed, but only abnormal results are displayed) Labs Reviewed - No data to display  EKG None  Radiology No results found.  Procedures Procedures (including critical care time)  Medications Ordered in ED Medications - No data to display   Initial Impression / Assessment and Plan / ED Course  I have reviewed the triage vital signs and the nursing notes.  Pertinent labs & imaging results that were available during my care of the patient were reviewed by me and considered in my medical decision making (see chart for details).        Patient with likely seasonal allergies.  Lungs without wheezing at this time.  Pulse ox is 90% on room air.  Respiratory rate is fine.  Will prescribe prednisone taper and return precautions given  Final Clinical Impressions(s) / ED Diagnoses   Final diagnoses:  None    ED Discharge Orders    None       Lacretia Leigh, MD 09/17/18 1603

## 2018-09-17 NOTE — Telephone Encounter (Signed)
Incoming call from Patient.  Patient. Complains of SOB and chest pain.  States pain is is in the middle of chest and on left side.  States it is that it is constant.  Onset was last night.  Other Sx.  99.5  LMP starting today.  Reports running nose. Rea.  Consulted for an appointment. Reason for Disposition . [1] Taking antihistamines > 2 days AND [2] nasal allergy symptoms interfere with sleep, school, or work  Answer Assessment - Initial Assessment Questions 1. LOCATION: "Where does it hurt?"       Middle mostly on left side 2. RADIATION: "Does the pain go anywhere else?" (e.g., into neck, jaw, arms, back)     fatigue 3. ONSET: "When did the chest pain begin?" (Minutes, hours or days)      4days ago  SOB chest pain last  4. PATTERN "Does the pain come and go, or has it been constant since it started?"  "Does it get worse with exertion?"      Constant 5. DURATION: "How long does it last" (e.g., seconds, minutes, hours)     *No Answer* 6. SEVERITY: "How bad is the pain?"  (e.g., Scale 1-10; mild, moderate, or severe)    - MILD (1-3): doesn't interfere with normal activities     - MODERATE (4-7): interferes with normal activities or awakens from sleep    - SEVERE (8-10): excruciating pain, unable to do any normal activities       moderate 7. CARDIAC RISK FACTORS: "Do you have any history of heart problems or risk factors for heart disease?" (e.g., prior heart attack, angina; high blood pressure, diabetes, being overweight, high cholesterol, smoking, or strong family history of heart disease)     denies 8. PULMONARY RISK FACTORS: "Do you have any history of lung disease?"  (e.g., blood clots in lung, asthma, emphysema, birth control pills)     Asthma since 12.   9. CAUSE: "What do you think is causing the chest pain?"     *No Answer* 10. OTHER SYMPTOMS: "Do you have any other symptoms?" (e.g., dizziness, nausea, vomiting, sweating, fever, difficulty breathing, cough)  Dizziness nausea  Light headedness 11. PREGNANCY: "Is there any chance you are pregnant?" "When was your last menstrual period?"       Starting now  Answer Assessment - Initial Assessment Questions 1. RESPIRATORY STATUS: "Describe your breathing?" (e.g., wheezing, shortness of breath, unable to speak, severe coughing)    If do to much activity cant breath  2. ONSET: "When did this breathing problem begin?"      Last week  3. PATTERN "Does the difficult breathing come and go, or has it been constant since it started?"      constant 4. SEVERITY: "How bad is your breathing?" (e.g., mild, moderate, severe)    - MILD: No SOB at rest, mild SOB with walking, speaks normally in sentences, can lay down, no retractions, pulse < 100.    - MODERATE: SOB at rest, SOB with minimal exertion and prefers to sit, cannot lie down flat, speaks in phrases, mild retractions, audible wheezing, pulse 100-120.    - SEVERE: Very SOB at rest, speaks in single words, struggling to breathe, sitting hunched forward, retractions, pulse > 120      mild 5. RECURRENT SYMPTOM: "Have you had difficulty breathing before?" If so, ask: "When was the last time?" and "What happened that time?"      6. CARDIAC HISTORY: "Do you have any history  of heart disease?" (e.g., heart attack, angina, bypass surgery, angioplasty)      *No Answer* 7. LUNG HISTORY: "Do you have any history of lung disease?"  (e.g., pulmonary embolus, asthma, emphysema)     *No Answer* 8. CAUSE: "What do you think is causing the breathing problem?"      *No Answer* 9. OTHER SYMPTOMS: "Do you have any other symptoms? (e.g., dizziness, runny nose, cough, chest pain, fever)     Runny nose congestion mucous- greenis clear 99.5 10. PREGNANCY: "Is there any chance you are pregnant?" "When was your last menstrual period?"       na 11. TRAVEL: "Have you traveled out of the country in the last month?" (e.g., travel history, exposures)       no  Protocols used: NASAL  ALLERGIES (HAY FEVER)-A-AH, CHEST PAIN-A-AH, BREATHING DIFFICULTY-A-AH

## 2018-09-17 NOTE — Progress Notes (Signed)
Shelby Jackson. , Shelby Jackson at [facility] - 28 y.o. female MRN [id number]  Date of birth: [birth year]  Visit Date: 09/17/2018  PCP: Vivi Barrack, MD   Referred by: Vivi Barrack, MD   Virtual Visit via Video (WebEx)  I connected with Shelby Jackson on 09/17/18 at  8:40 AM EDT by a video enabled telemedicine application and verified that I am speaking with the correct person using two identifiers. Location patient: Home Location provider: Provider office Persons participating in the virtual visit: Patient only  I discussed the limitations of evaluation and management by telemedicine and the availability of in person appointments. The patient expressed understanding and agreed to proceed.   SUBJECTIVE:   Chief Complaint  Patient presents with  . Right Leg - Initial Assessment    Doppler to r/o DVT 04/26/2018. Sx worsening x several months. Notes swelling, n/t, weakness; worse after prolonged standing.   . Left Leg - Initial Assessment    HPI: Patient reports with above symptoms.  She has had pain in her bilateral shins calves and thighs that is progressively worsened over the past several years.  She reports is been present for more than 3 years.  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  She does believe her weight has something to do with this.  She is undergone fairly extensive work-up previously with bilateral lower extremity Dopplers that were reviewed as below.  She describes numbness weakness swelling and nodules that are tender to touch.  She is tried rest heat and elevation.  She was previously prescribed compression socks but has not been using these in the last several years.  Symptoms do improve with elevation of her legs.  REVIEW OF SYSTEMS: Night time  disturbances: Reports Fevers, chills and night sweats: Denies Unexplained weight loss: Denies Personal history of cancer: Denies Changes in bowel or bladder habits: Denies Recent unreported falls: Denies New or worsening dyspnea or wheezing: Denies Headaches and dizziness: Denies Numbness and weakness in the extremities: Reports, Mild symptoms, no giving way Dizziness or presyncopal episodes: Denies Lower extremity edema: Reports, Dependent in nature  HISTORY:  Prior history reviewed and updated per electronic medical record.  Patient Active Problem List   Diagnosis Date Noted  . Leg mass, bilateral 04/19/2018  . Mild persistent asthma 04/19/2018  . Eczema 04/19/2018  . Elevated serum creatinine 04/19/2018   Social History   Occupational History  . Not on file  Tobacco Use  . Smoking status: Never Smoker  . Smokeless tobacco: Never Used  Substance and Sexual Activity  . Alcohol use: No  . Drug use: No  . Sexual activity: Yes    Birth control/protection: Condom   Social History   Social History Narrative  . Not on file   Past Medical History:  Diagnosis Date  . Abnormal Pap smear of cervix 2009  . Anxiety   . Asthma   . Depression   . Elevated serum creatinine   . Fibroids   . H/O chlamydia infection 07/2012  . UTI (lower urinary tract infection)    No past surgical history on file. family history includes Anxiety disorder in her father; Arthritis in her father; Diabetes in her father; Heart disease in her father and maternal grandfather; Hypothyroidism in her paternal aunt and  sister.  OBJECTIVE:  VS:  HT:5' (152.4 cm)   WT:220 lb (99.8 kg)  BMI:42.97    BP:   HR: bpm  TEMP:98.5 F (36.9 C)(Oral)  RESP:    PHYSICAL EXAM: GENERAL: Alert, appears well and in no acute distress. HEENT: Atraumatic, conjunctiva clear, no obvious abnormalities on inspection of external nose and ears. NECK: Normal movements of the head and neck. CARDIOPULMONARY: No increased  WOB. Speaking in clear sentences. I:E ratio WNL.  MS: Moves all visible extremities without noticeable abnormality. PSYCH: Pleasant and cooperative, well-groomed. Speech normal rate and rhythm. Affect is appropriate. Insight and judgement are appropriate. Attention is focused, linear, and appropriate.  NEURO: CN grossly intact. Oriented as arrived to appointment on time with no prompting. Moves both UE equally.  SKIN: No obvious lesions, wounds, erythema, or cyanosis noted on face or hands.  04/26/2018: Bilateral lower extremity ultrasounds.  Superficial 5 mm hypoechoic lesion within the right leg likely representing small chronic superficial vein thrombosis versus area of fat necrosis.  Left lower extremity possible fascial herniation.      ASSESSMENT:   1. Bilateral leg pain   2. Venous insufficiency of both lower extremities   3. Venous stasis syndrome   4. 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  No problem-specific Assessment & Plan notes found for this encounter.   Long discussion today with the patient.  Symptoms do seem most consistent with the above diagnoses.  We discussed the importance of daily compression socks, weight loss, avoidance of dependent position.    Can consider thigh-high compression if any lack of improvement versus further diagnostic evaluation with venous reflux studies.  Activity modifications and the importance of avoiding exacerbating activities (limiting pain to no more than a 4 / 10 during or following activity) recommended and discussed.   Discussed red flag symptoms that warrant earlier emergent evaluation and patient voices understanding.   No orders of the defined types were placed in this encounter.  Lab Orders  No laboratory test(s)  ordered today   Imaging Orders  No imaging studies ordered today   Referral Orders  No referral(s) requested today     Return in about 6 weeks (around 10/29/2018).  Hopefully for clinical exam at that time we can repeat virtual visit if indicated.         Gerda Diss, [facility] Physician

## 2018-09-18 ENCOUNTER — Emergency Department (HOSPITAL_COMMUNITY)
Admission: EM | Admit: 2018-09-18 | Discharge: 2018-09-18 | Disposition: A | Discharge status: Home / Self Care | Payer: BLUE CROSS/BLUE SHIELD | Attending: Emergency Medicine | Admitting: Emergency Medicine

## 2018-09-18 ENCOUNTER — Encounter (HOSPITAL_COMMUNITY): Payer: Self-pay

## 2018-09-18 ENCOUNTER — Emergency Department (HOSPITAL_COMMUNITY): Payer: BLUE CROSS/BLUE SHIELD

## 2018-09-18 ENCOUNTER — Other Ambulatory Visit: Payer: Self-pay

## 2018-09-18 DIAGNOSIS — R0602 Shortness of breath: Secondary | ICD-10-CM | POA: Diagnosis not present

## 2018-09-18 DIAGNOSIS — J45909 Unspecified asthma, uncomplicated: Secondary | ICD-10-CM | POA: Insufficient documentation

## 2018-09-18 DIAGNOSIS — K219 Gastro-esophageal reflux disease without esophagitis: Secondary | ICD-10-CM | POA: Diagnosis not present

## 2018-09-18 DIAGNOSIS — R0789 Other chest pain: Secondary | ICD-10-CM | POA: Diagnosis not present

## 2018-09-18 DIAGNOSIS — R079 Chest pain, unspecified: Secondary | ICD-10-CM | POA: Diagnosis not present

## 2018-09-18 DIAGNOSIS — Z91040 Latex allergy status: Secondary | ICD-10-CM | POA: Diagnosis not present

## 2018-09-18 LAB — BASIC METABOLIC PANEL
Anion gap: 7 (ref 5–15)
BUN: 18 mg/dL (ref 6–20)
CO2: 22 mmol/L (ref 22–32)
Calcium: 9.4 mg/dL (ref 8.9–10.3)
Chloride: 109 mmol/L (ref 98–111)
Creatinine, Ser: 0.99 mg/dL (ref 0.44–1.00)
GFR calc Af Amer: >60 mL/min (ref >60)
GFR calc non Af Amer: >60 mL/min (ref >60)
Glucose, Bld: 130 mg/dL — ABNORMAL HIGH (ref 70–99)
Potassium: 3.9 mmol/L (ref 3.5–5.1)
Sodium: 138 mmol/L (ref 135–145)

## 2018-09-18 LAB — I-STAT BETA HCG BLOOD, ED (MC, WL, AP ONLY): I-stat hCG, quantitative: <5 m[IU]/mL (ref <5)

## 2018-09-18 LAB — D-DIMER, QUANTITATIVE: D-Dimer, Quant: <0.27 ug/mL-FEU (ref 0.00–0.50)

## 2018-09-18 MED ORDER — ALUM & MAG HYDROXIDE-SIMETH 200-200-20 MG/5ML PO SUSP
30.0000 mL | Freq: Once | ORAL | Status: AC
  Administered 2018-09-18: 22:00:00 30 mL via ORAL
  Filled 2018-09-18: qty 30

## 2018-09-18 MED ORDER — OMEPRAZOLE 20 MG PO CPDR: 20.0000 mg | DELAYED_RELEASE_CAPSULE | Freq: Every day | ORAL | 0 refills | Status: DC

## 2018-09-18 NOTE — ED Notes (Signed)
Patient transported to X-ray 

## 2018-09-18 NOTE — ED Triage Notes (Signed)
Pt reports continued SOB and centralized chest wall pain continuing from yesterday. Denies sick contacts. Endorses a dry cough starting today. Denies fever at home.

## 2018-09-18 NOTE — ED Provider Notes (Signed)
[facility] DEPT Provider Note   CSN: [id number] Arrival date & time: 09/18/18  2023    History   Chief Complaint Chief Complaint  Patient presents with  . Shortness of Breath    HPI Shelby Jackson is a 28 y.o. female.     The history is provided by the patient and medical records. No language interpreter was used.  Shortness of Breath     28 year old female with history of asthma, anxiety, presenting for evaluation of shortness of breath.  Patient report 2 days ago she developed congestion along with shortness of breath.  She thought it was allergy, but symptoms persist wanting her to come to the ER.  She was seen in the ED, was prescribed prednisone in which she has been taking as prescribed however symptoms persist.  Today she endorsed significant pain to her mid chest.  Pain is pleuritic, states she feels out of breath and symptoms are not improving.  No report of fever, wheezing, nausea vomiting or diarrhea.  She admits that she has history of asthma but she hasn't had any wheezing.  She denies any prior history of PE or DVT but states there is a family history of it.  She denies any recent travel or recent surgery.  She denies any leg pain or calf swelling.  She is not taking any oral hormone.  She denies any recent sick contact with anyone with COVID-19.  Past Medical History:  Diagnosis Date  . Abnormal Pap smear of cervix 2009  . Anxiety   . Asthma   . Depression   . Elevated serum creatinine   . Fibroids   . H/O chlamydia infection 07/2012  . UTI (lower urinary tract infection)     Patient Active Problem List   Diagnosis Date Noted  . Leg mass, bilateral 04/19/2018  . Mild persistent asthma 04/19/2018  . Eczema 04/19/2018  . Elevated serum creatinine 04/19/2018    History reviewed. No pertinent surgical history.   OB History    Gravida  0   Para  0   Term  0   Preterm  0   AB  0   Living  0     SAB  0   TAB   0   Ectopic  0   Multiple  0   Live Births  0            Home Medications    Prior to Admission medications   Medication Sig Start Date End Date Taking? Authorizing Provider  ipratropium (ATROVENT) 0.06 % nasal spray Place 2 sprays into both nostrils 4 (four) times daily. 09/17/18   Lacretia Leigh, MD  predniSONE (STERAPRED UNI-PAK 21 TAB) 10 MG (21) TBPK tablet Take by mouth daily. Take 6 tabs by mouth daily  for 2 days, then 5 tabs for 2 days, then 4 tabs for 2 days, then 3 tabs for 2 days, 2 tabs for 2 days, then 1 tab by mouth daily for 2 days 09/17/18   Lacretia Leigh, MD  triamcinolone cream (KENALOG) 0.1 % Apply 1 application topically 2 (two) times daily. 04/19/18   Vivi Barrack, MD    Family History Family History  Problem Relation Age of Onset  . Anxiety disorder Father   . Diabetes Father   . Heart disease Father   . Arthritis Father   . Hypothyroidism Paternal Aunt   . Heart disease Maternal Grandfather   . Hypothyroidism Sister     Social  History Social History   Tobacco Use  . Smoking status: Never Smoker  . Smokeless tobacco: Never Used  Substance Use Topics  . Alcohol use: No  . Drug use: No     Allergies   Pineapple; Albuterol; Caffeine; and Latex   Review of Systems Review of Systems  Respiratory: Positive for shortness of breath.   All other systems reviewed and are negative.    Physical Exam Updated Vital Signs BP (!) 157/97 (BP Location: Left Arm)   Pulse (!) 105   Temp 99.3 F (37.4 C) (Oral)   Resp 18   LMP 09/17/2018   SpO2 98%   Physical Exam Vitals signs and nursing note reviewed.  Constitutional:      General: She is not in acute distress.    Appearance: She is well-developed.  HENT:     Head: Atraumatic.  Eyes:     Conjunctiva/sclera: Conjunctivae normal.  Neck:     Musculoskeletal: Neck supple.  Cardiovascular:     Rate and Rhythm: Tachycardia present.  Pulmonary:     Effort: Pulmonary effort is normal.      Breath sounds: Normal breath sounds. No decreased breath sounds, wheezing, rhonchi or rales.  Chest:     Chest wall: No tenderness.  Abdominal:     Palpations: Abdomen is soft.  Musculoskeletal:     Right lower leg: No edema.     Left lower leg: No edema.  Skin:    Findings: No rash.  Neurological:     Mental Status: She is alert and oriented to person, place, and time.  Psychiatric:        Mood and Affect: Mood normal.      ED Treatments / Results  Labs (all labs ordered are listed, but only abnormal results are displayed) Labs Reviewed  BASIC METABOLIC PANEL - Abnormal; Notable for the following components:      Result Value   Glucose, Bld 130 (*)    All other components within normal limits  D-DIMER, QUANTITATIVE (NOT AT [facility])  I-STAT BETA HCG BLOOD, ED ([facility], [facility], [facility] ONLY)    EKG EKG Interpretation  Date/Time:  Wednesday September 18 2018 21:27:47 EDT Ventricular Rate:  80 PR Interval:    QRS Duration: 84 QT Interval:  357 QTC Calculation: 412 R Axis:   75 Text Interpretation:  Sinus rhythm Artifact Confirmed by Fredia Sorrow [phone]) on 09/18/2018 9:46:52 PM   Radiology Dg Chest 2 View  Result Date: 09/18/2018 CLINICAL DATA:  Chest pain and shortness of breath EXAM: CHEST - 2 VIEW COMPARISON:  Oct 25, 2012 FINDINGS: The lungs are clear. The heart size and pulmonary vascularity are normal. No adenopathy. No pneumothorax. No bone lesions. IMPRESSION: No edema or consolidation. Electronically Signed   By: Lowella Grip III M.D.   On: 09/18/2018 20:53    Procedures Procedures (including critical care time)  Medications Ordered in ED Medications  alum & mag hydroxide-simeth (MAALOX/MYLANTA) 200-200-20 MG/5ML suspension 30 mL (30 mLs Oral Given 09/18/18 2158)     Initial Impression / Assessment and Plan / ED Course  I have reviewed the triage vital signs and the nursing notes.  Pertinent labs & imaging results that were available during my care of the patient  were reviewed by me and considered in my medical decision making (see chart for details).        BP (!) 150/92 (BP Location: Left Arm)   Pulse 99   Temp 98.6 F (37 C) (Oral)   Resp  19   LMP 09/17/2018   SpO2 96%    Final Clinical Impressions(s) / ED Diagnoses   Final diagnoses:  Shortness of breath  Gastroesophageal reflux disease, esophagitis presence not specified    ED Discharge Orders         Ordered    omeprazole (PRILOSEC) 20 MG capsule  Daily     09/18/18 2223         9:08 PM Patient here with pleuritic chest pain and shortness of breath.  History of asthma but she is currently not wheezing.  She was seen yesterday for her symptoms and discharged home with prednisone.  Given worsening symptom and she is tachycardic, will obtain d-dimer to assess for potential PE.  Chest x-ray is unremarkable.  10:10 PM -X-ray unremarkable, normal d-dimer therefore low suspicion for PE.  EKG without concerning changes.  Pregnancy test is negative, electrolyte panels are reassuring. Pt report improvement of sxs after GI cocktail.  Suspect some component of gastritis causing her discomfort.  Will d/c home with prilosec.  Outpt f/u recommended. Return precaution discussed.   Domenic Moras, PA-C 09/18/18 2224    Fredia Sorrow, MD 09/19/18 [phone]

## 2018-09-18 NOTE — ED Notes (Signed)
Bed: WTR7 Expected date:  Expected time:  Means of arrival:  Comments: 

## 2018-09-22 ENCOUNTER — Encounter: Payer: Self-pay | Admitting: Family Medicine

## 2018-09-26 ENCOUNTER — Encounter: Payer: Self-pay | Admitting: Family Medicine

## 2018-09-26 ENCOUNTER — Ambulatory Visit (INDEPENDENT_AMBULATORY_CARE_PROVIDER_SITE_OTHER): Payer: BLUE CROSS/BLUE SHIELD | Admitting: Family Medicine

## 2018-09-26 VITALS — Temp 97.2°F | Ht 60.0 in | Wt 213.4 lb

## 2018-09-26 DIAGNOSIS — K219 Gastro-esophageal reflux disease without esophagitis: Secondary | ICD-10-CM

## 2018-09-26 DIAGNOSIS — J4530 Mild persistent asthma, uncomplicated: Secondary | ICD-10-CM | POA: Diagnosis not present

## 2018-09-26 MED ORDER — PANTOPRAZOLE SODIUM 40 MG PO TBEC: 40.0000 mg | DELAYED_RELEASE_TABLET | Freq: Every day | ORAL | 0 refills | Status: DC | PRN

## 2018-09-26 MED ORDER — BECLOMETHASONE DIPROPIONATE 80 MCG/ACT IN AERS: 1.0000 | INHALATION_SPRAY | Freq: Two times a day (BID) | RESPIRATORY_TRACT | 12 refills | Status: DC

## 2018-09-26 NOTE — Assessment & Plan Note (Signed)
No red flags.  Symptoms consistent with mild flare.  She will continue her prednisone taper.  We will also start Qvar 80 mcg 1 puff twice daily.  Discussed reasons to return to care or seek emergent care.

## 2018-09-26 NOTE — Assessment & Plan Note (Signed)
Seems to be improving with dietary modifications.  She is down about 7 pounds over the last week.  Congratulated patient on this.  Encouraged continued dietary modifications.  Will stop omeprazole as it does not seem to be very effective.  Will start Protonix 40 mg daily as needed.  Discussed reasons to return to care.

## 2018-09-26 NOTE — Progress Notes (Signed)
    Chief Complaint:  Shelby Jackson is a 28 y.o. female who presents today for a virtual office visit with a chief complaint of GERD.   Assessment/Plan:  Mild persistent asthma No red flags.  Symptoms consistent with mild flare.  She will continue her prednisone taper.  We will also start Qvar 80 mcg 1 puff twice daily.  Discussed reasons to return to care or seek emergent care.  Gastroesophageal reflux disease without esophagitis Seems to be improving with dietary modifications.  She is down about 7 pounds over the last week.  Congratulated patient on this.  Encouraged continued dietary modifications.  Will stop omeprazole as it does not seem to be very effective.  Will start Protonix 40 mg daily as needed.  Discussed reasons to return to care.     Subjective:  HPI:  Chest Discomfort New problem to provider. Patient presented to the ED on 09/18/2018 with chest pain and shortness of breath. Work up there included negative d-dimer, EKG, and chest xray. Patient was diagnosed with GERD and started on prilosec.  She has made significant dietary modifications since then and has noticed significant improvement in her symptoms.  She do not feel like omeprazole is very effective.  She still has elevated reflux however her chest discomfort has essentially resolved.  # Asthma Patient was previously using albuterol as needed.  She has had a recent flareup due to current pollen outbreak.  She was started on prednisone in the ED for presumed asthma flare.  Symptoms are a little better however still has significant issues, especially when going outside.  She has been on Qvar in the past and would like to restart this.  No wheezing.  ROS: Per HPI  PMH: She reports that she has never smoked. She has never used smokeless tobacco. She reports that she does not drink alcohol or use drugs.      Objective/Observations  Physical Exam: Gen: NAD, resting comfortably Pulm: Normal work of breathing,  speaking in full sentences. Neuro: Grossly normal, moves all extremities Psych: Normal affect and thought content  Virtual Visit via Video   I connected with Shelby Jackson on 09/26/18 at 10:00 AM EDT by a video enabled telemedicine application and verified that I am speaking with the correct person using two identifiers. I discussed the limitations of evaluation and management by telemedicine and the availability of in person appointments. The patient expressed understanding and agreed to proceed.   Patient location: Home Provider location: [place] participating in the virtual visit: Myself and Mack Hook. Jerline Pain, MD 09/26/2018 10:20 AM

## 2018-12-22 ENCOUNTER — Other Ambulatory Visit: Payer: Self-pay | Admitting: Family Medicine

## 2019-01-14 ENCOUNTER — Encounter (HOSPITAL_COMMUNITY): Payer: Self-pay

## 2019-01-14 ENCOUNTER — Other Ambulatory Visit: Payer: Self-pay

## 2019-01-14 ENCOUNTER — Emergency Department (HOSPITAL_COMMUNITY)
Admission: EM | Admit: 2019-01-14 | Discharge: 2019-01-14 | Disposition: A | Discharge status: Home / Self Care | Payer: BLUE CROSS/BLUE SHIELD | Attending: Emergency Medicine | Admitting: Emergency Medicine

## 2019-01-14 DIAGNOSIS — T50905A Adverse effect of unspecified drugs, medicaments and biological substances, initial encounter: Secondary | ICD-10-CM

## 2019-01-14 DIAGNOSIS — Z91040 Latex allergy status: Secondary | ICD-10-CM | POA: Insufficient documentation

## 2019-01-14 DIAGNOSIS — M791 Myalgia, unspecified site: Secondary | ICD-10-CM | POA: Insufficient documentation

## 2019-01-14 DIAGNOSIS — T887XXA Unspecified adverse effect of drug or medicament, initial encounter: Secondary | ICD-10-CM | POA: Insufficient documentation

## 2019-01-14 DIAGNOSIS — R609 Edema, unspecified: Secondary | ICD-10-CM | POA: Insufficient documentation

## 2019-01-14 DIAGNOSIS — J45909 Unspecified asthma, uncomplicated: Secondary | ICD-10-CM | POA: Insufficient documentation

## 2019-01-14 DIAGNOSIS — Y69 Unspecified misadventure during surgical and medical care: Secondary | ICD-10-CM | POA: Insufficient documentation

## 2019-01-14 DIAGNOSIS — R6883 Chills (without fever): Secondary | ICD-10-CM | POA: Insufficient documentation

## 2019-01-14 MED ORDER — NITROFURANTOIN MONOHYD MACRO 100 MG PO CAPS: 100.0000 mg | ORAL_CAPSULE | Freq: Two times a day (BID) | ORAL | 0 refills | Status: DC

## 2019-01-14 NOTE — ED Triage Notes (Addendum)
Pt states she started sulfamethoxazole last night. At 0400-0500 started pain in legs, back, and shoulders, like something is pulling at skin, and chills. Denies SHOB/CP. Denies N/V/D. Denies cough. Pt speaking in full sentences, NAD.

## 2019-01-14 NOTE — ED Provider Notes (Signed)
[facility] DEPT Provider Note   CSN: [id number] Arrival date & time: 01/14/19  7989     History   Chief Complaint Chief Complaint  Patient presents with  . Allergic Reaction    HPI Shelby Jackson is a 28 y.o. female.     Patient is a 28 year old female with a history of asthma and GERD who is presenting today with diffuse body pain and chills.  Patient states for the last 3 to 4 days she had had dysuria, frequency and urgency and saw her doctor yesterday and was diagnosed with urinary tract infection and started on Bactrim.  She states prior to that she was not having fever, back pain or general myalgias.  She is never taken Bactrim before and states she was fine when she took it yesterday but woke up at 4:00 in the morning with diffuse body pain more pronounced in the back and the legs and her skin feels tight.  She is not noticed a rash and denies any oral lesions.  She is not having any shortness of breath.  She states she has felt chilled intermittently since 4:00 as well.   The history is provided by the patient.  Allergic Reaction Presenting symptoms: swelling   Presenting symptoms: no difficulty breathing, no difficulty swallowing, no itching and no rash   Presenting symptoms comment:  Diffuse body pain Severity:  Moderate Duration:  6 hours Prior allergic episodes:  No prior episodes Context: medications   Context comment:  Started Bactrim yesterday for urinary tract infection which she is never had that antibiotic before Relieved by:  None tried Worsened by:  Nothing Ineffective treatments:  None tried   Past Medical History:  Diagnosis Date  . Abnormal Pap smear of cervix 2009  . Anxiety   . Asthma   . Depression   . Elevated serum creatinine   . Fibroids   . H/O chlamydia infection 07/2012  . UTI (lower urinary tract infection)     Patient Active Problem List   Diagnosis Date Noted  . Gastroesophageal reflux disease  without esophagitis 09/26/2018  . Leg mass, bilateral 04/19/2018  . Mild persistent asthma 04/19/2018  . Eczema 04/19/2018  . Elevated serum creatinine 04/19/2018    History reviewed. No pertinent surgical history.   OB History    Gravida  0   Para  0   Term  0   Preterm  0   AB  0   Living  0     SAB  0   TAB  0   Ectopic  0   Multiple  0   Live Births  0            Home Medications    Prior to Admission medications   Medication Sig Start Date End Date Taking? Authorizing Provider  beclomethasone (QVAR) 80 MCG/ACT inhaler Inhale 1 puff into the lungs 2 (two) times daily. 09/26/18   Vivi Barrack, MD  ipratropium (ATROVENT) 0.06 % nasal spray Place 2 sprays into both nostrils 4 (four) times daily. 09/17/18   Lacretia Leigh, MD  nitrofurantoin, macrocrystal-monohydrate, (MACROBID) 100 MG capsule Take 1 capsule (100 mg total) by mouth 2 (two) times daily. 01/14/19   Blanchie Dessert, MD  pantoprazole (PROTONIX) 40 MG tablet TAKE 1 TABLET BY MOUTH DAILY AS NEEDED 12/23/18   Vivi Barrack, MD  predniSONE (STERAPRED UNI-PAK 21 TAB) 10 MG (21) TBPK tablet Take by mouth daily. Take 6 tabs by mouth daily  for 2 days, then 5 tabs for 2 days, then 4 tabs for 2 days, then 3 tabs for 2 days, 2 tabs for 2 days, then 1 tab by mouth daily for 2 days 09/17/18   Lacretia Leigh, MD  triamcinolone cream (KENALOG) 0.1 % Apply 1 application topically 2 (two) times daily. 04/19/18   Vivi Barrack, MD    Family History Family History  Problem Relation Age of Onset  . Anxiety disorder Father   . Diabetes Father   . Heart disease Father   . Arthritis Father   . Hypothyroidism Paternal Aunt   . Heart disease Maternal Grandfather   . Hypothyroidism Sister     Social History Social History   Tobacco Use  . Smoking status: Never Smoker  . Smokeless tobacco: Never Used  Substance Use Topics  . Alcohol use: No  . Drug use: No     Allergies   Pineapple, Albuterol, Caffeine,  and Latex   Review of Systems Review of Systems  HENT: Negative for trouble swallowing.   Skin: Negative for itching and rash.  All other systems reviewed and are negative.    Physical Exam Updated Vital Signs BP (!) 126/93 (BP Location: Right Arm)   Pulse 91   Temp 99 F (37.2 C) (Oral)   Resp 14   Ht 5' (1.524 m)   Wt 96.2 kg   LMP 12/29/2018   SpO2 98%   BMI 41.40 kg/m   Physical Exam Vitals signs and nursing note reviewed.  Constitutional:      General: She is not in acute distress.    Appearance: She is well-developed. She is obese.  HENT:     Head: Normocephalic and atraumatic.     Nose: Nose normal.     Mouth/Throat:     Mouth: Mucous membranes are moist.     Comments: No oral lesions present Eyes:     Pupils: Pupils are equal, round, and reactive to light.  Cardiovascular:     Rate and Rhythm: Normal rate and regular rhythm.     Heart sounds: Normal heart sounds. No murmur. No friction rub.  Pulmonary:     Effort: Pulmonary effort is normal.     Breath sounds: Normal breath sounds. No wheezing or rales.  Abdominal:     General: Bowel sounds are normal. There is no distension.     Palpations: Abdomen is soft.     Tenderness: There is no abdominal tenderness. There is no guarding or rebound.  Musculoskeletal: Normal range of motion.        General: No tenderness.     Comments: No edema  Skin:    General: Skin is warm and dry.     Findings: No rash.     Comments: Tenderness with palpating the skin of the back and legs but the skin is otherwise intact.  There is no rashes present, notable swelling or erythema  Neurological:     Mental Status: She is alert and oriented to person, place, and time.     Cranial Nerves: No cranial nerve deficit.  Psychiatric:        Mood and Affect: Mood normal.        Behavior: Behavior normal.        Thought Content: Thought content normal.      ED Treatments / Results  Labs (all labs ordered are listed, but only  abnormal results are displayed) Labs Reviewed - No data to display  EKG None  Radiology  No results found.  Procedures Procedures (including critical care time)  Medications Ordered in ED Medications - No data to display   Initial Impression / Assessment and Plan / ED Course  I have reviewed the triage vital signs and the nursing notes.  Pertinent labs & imaging results that were available during my care of the patient were reviewed by me and considered in my medical decision making (see chart for details).        Patient presenting today with symptoms concerning for an adverse drug reaction to Bactrim.  She does not display any signs of SJS or TENs at this time.  There is no pronounced anaphylactic reaction as she has normal blood pressure, no rash or respiratory symptoms.  Low suspicion that this is pyelonephritis or kidney stone.  Recommended that patient stop the Bactrim and she was started on Macrobid for her UTI.  Also she was given strict return precautions if she develops rash or lesions in her mouth.  She was also instructed to try Benadryl and Tylenol.  Final Clinical Impressions(s) / ED Diagnoses   Final diagnoses:  Adverse drug reaction, initial encounter    ED Discharge Orders         Ordered    nitrofurantoin, macrocrystal-monohydrate, (MACROBID) 100 MG capsule  2 times daily     01/14/19 0758           Blanchie Dessert, MD 01/14/19 [phone]

## 2019-01-14 NOTE — Discharge Instructions (Signed)
Stop taking the Bactrim antibiotic and list as an allergy.  Try tylenol as needed for the pain and try benadryl 50mg  to see if helps with the pain.  If you develop a rash, sores in your mouth, trouble breathing or other concerns please return for repeat evaluation.

## 2019-01-25 ENCOUNTER — Emergency Department (HOSPITAL_COMMUNITY): Payer: Self-pay

## 2019-01-25 ENCOUNTER — Encounter (HOSPITAL_COMMUNITY): Payer: Self-pay

## 2019-01-25 ENCOUNTER — Emergency Department (HOSPITAL_COMMUNITY)
Admission: EM | Admit: 2019-01-25 | Discharge: 2019-01-25 | Disposition: A | Discharge status: Home / Self Care | Payer: Self-pay | Attending: Emergency Medicine | Admitting: Emergency Medicine

## 2019-01-25 ENCOUNTER — Other Ambulatory Visit: Payer: Self-pay

## 2019-01-25 DIAGNOSIS — Z79899 Other long term (current) drug therapy: Secondary | ICD-10-CM | POA: Insufficient documentation

## 2019-01-25 DIAGNOSIS — K5900 Constipation, unspecified: Secondary | ICD-10-CM | POA: Insufficient documentation

## 2019-01-25 DIAGNOSIS — Z91040 Latex allergy status: Secondary | ICD-10-CM | POA: Insufficient documentation

## 2019-01-25 DIAGNOSIS — R109 Unspecified abdominal pain: Secondary | ICD-10-CM

## 2019-01-25 DIAGNOSIS — J45909 Unspecified asthma, uncomplicated: Secondary | ICD-10-CM | POA: Insufficient documentation

## 2019-01-25 LAB — CBC
HCT: 40.7 % (ref 36.0–46.0)
Hemoglobin: 13.7 g/dL (ref 12.0–15.0)
MCH: 32.2 pg (ref 26.0–34.0)
MCHC: 33.7 g/dL (ref 30.0–36.0)
MCV: 95.8 fL (ref 80.0–100.0)
Platelets: 177 10*3/uL (ref 150–400)
RBC: 4.25 MIL/uL (ref 3.87–5.11)
RDW: 12.5 % (ref 11.5–15.5)
WBC: 6.6 10*3/uL (ref 4.0–10.5)
nRBC: 0 % (ref 0.0–0.2)

## 2019-01-25 LAB — COMPREHENSIVE METABOLIC PANEL
ALT: 20 U/L (ref 0–44)
AST: 18 U/L (ref 15–41)
Albumin: 3.9 g/dL (ref 3.5–5.0)
Alkaline Phosphatase: 47 U/L (ref 38–126)
Anion gap: 10 (ref 5–15)
BUN: 14 mg/dL (ref 6–20)
CO2: 24 mmol/L (ref 22–32)
Calcium: 9.2 mg/dL (ref 8.9–10.3)
Chloride: 104 mmol/L (ref 98–111)
Creatinine, Ser: 0.89 mg/dL (ref 0.44–1.00)
GFR calc Af Amer: >60 mL/min (ref >60)
GFR calc non Af Amer: >60 mL/min (ref >60)
Glucose, Bld: 88 mg/dL (ref 70–99)
Potassium: 4.2 mmol/L (ref 3.5–5.1)
Sodium: 138 mmol/L (ref 135–145)
Total Bilirubin: 0.4 mg/dL (ref 0.3–1.2)
Total Protein: 7.4 g/dL (ref 6.5–8.1)

## 2019-01-25 LAB — URINALYSIS, ROUTINE W REFLEX MICROSCOPIC
Bilirubin Urine: NEGATIVE
Glucose, UA: NEGATIVE mg/dL
Hgb urine dipstick: NEGATIVE
Ketones, ur: NEGATIVE mg/dL
Leukocytes,Ua: NEGATIVE
Nitrite: NEGATIVE
Protein, ur: NEGATIVE mg/dL
Specific Gravity, Urine: 1.013 (ref 1.005–1.030)
pH: 5 (ref 5.0–8.0)

## 2019-01-25 LAB — I-STAT BETA HCG BLOOD, ED (MC, WL, AP ONLY): I-stat hCG, quantitative: <5 m[IU]/mL (ref <5)

## 2019-01-25 LAB — LIPASE, BLOOD: Lipase: 35 U/L (ref 11–51)

## 2019-01-25 MED ORDER — SODIUM CHLORIDE 0.9% FLUSH: 3.0000 mL | Freq: Once | INTRAVENOUS | Status: DC

## 2019-01-25 MED ORDER — POLYETHYLENE GLYCOL 3350 17 G PO PACK: PACK | ORAL | 0 refills | Status: AC

## 2019-01-25 NOTE — ED Provider Notes (Signed)
[facility] DEPT Provider Note   CSN: [id number] Arrival date & time: 01/25/19  1116     History   Chief Complaint Chief Complaint  Patient presents with  . Flank Pain  . Abdominal Pain    HPI Shelby Jackson is a 28 y.o. female.     Patient to ED for evaluation of abdominal pain across lower abdomen into left flank for several days. She experienced similar but lesser symptoms 3 weeks ago and was treated with antibiotics for a UTI, though she states she was told her urine was clear. No fever, vomiting, vaginal discharge. She reports her last bowel movement was yesterday but she has not been going a regular basis.   The history is provided by the patient. No language interpreter was used.  Flank Pain Associated symptoms include abdominal pain.  Abdominal Pain Associated symptoms: no chills, no dysuria, no fever, no vaginal discharge and no vomiting     Past Medical History:  Diagnosis Date  . Abnormal Pap smear of cervix 2009  . Anxiety   . Asthma   . Depression   . Elevated serum creatinine   . Fibroids   . H/O chlamydia infection 07/2012  . UTI (lower urinary tract infection)     Patient Active Problem List   Diagnosis Date Noted  . Gastroesophageal reflux disease without esophagitis 09/26/2018  . Leg mass, bilateral 04/19/2018  . Mild persistent asthma 04/19/2018  . Eczema 04/19/2018  . Elevated serum creatinine 04/19/2018    History reviewed. No pertinent surgical history.   OB History    Gravida  0   Para  0   Term  0   Preterm  0   AB  0   Living  0     SAB  0   TAB  0   Ectopic  0   Multiple  0   Live Births  0            Home Medications    Prior to Admission medications   Medication Sig Start Date End Date Taking? Authorizing Provider  beclomethasone (QVAR) 80 MCG/ACT inhaler Inhale 1 puff into the lungs 2 (two) times daily. 09/26/18   Vivi Barrack, MD  ipratropium (ATROVENT) 0.06 %  nasal spray Place 2 sprays into both nostrils 4 (four) times daily. 09/17/18   Lacretia Leigh, MD  nitrofurantoin, macrocrystal-monohydrate, (MACROBID) 100 MG capsule Take 1 capsule (100 mg total) by mouth 2 (two) times daily. 01/14/19   Blanchie Dessert, MD  pantoprazole (PROTONIX) 40 MG tablet TAKE 1 TABLET BY MOUTH DAILY AS NEEDED 12/23/18   Vivi Barrack, MD  predniSONE (STERAPRED UNI-PAK 21 TAB) 10 MG (21) TBPK tablet Take by mouth daily. Take 6 tabs by mouth daily  for 2 days, then 5 tabs for 2 days, then 4 tabs for 2 days, then 3 tabs for 2 days, 2 tabs for 2 days, then 1 tab by mouth daily for 2 days 09/17/18   Lacretia Leigh, MD  triamcinolone cream (KENALOG) 0.1 % Apply 1 application topically 2 (two) times daily. 04/19/18   Vivi Barrack, MD    Family History Family History  Problem Relation Age of Onset  . Anxiety disorder Father   . Diabetes Father   . Heart disease Father   . Arthritis Father   . Hypothyroidism Paternal Aunt   . Heart disease Maternal Grandfather   . Hypothyroidism Sister     Social History Social History  Tobacco Use  . Smoking status: Never Smoker  . Smokeless tobacco: Never Used  Substance Use Topics  . Alcohol use: No  . Drug use: No     Allergies   Pineapple, Albuterol, Bactrim [sulfamethoxazole-trimethoprim], Caffeine, and Latex   Review of Systems Review of Systems  Constitutional: Negative for chills and fever.  HENT: Negative.   Respiratory: Negative.   Cardiovascular: Negative.   Gastrointestinal: Positive for abdominal pain. Negative for vomiting.  Genitourinary: Positive for flank pain. Negative for dysuria, frequency and vaginal discharge.  Skin: Negative.   Neurological: Negative.      Physical Exam Updated Vital Signs BP (!) 130/91 (BP Location: Left Arm)   Pulse 84   Temp 99.3 F (37.4 C) (Oral)   Resp 16   Wt 96 kg   LMP 12/29/2018   SpO2 96%   BMI 41.33 kg/m   Physical Exam Vitals signs and nursing note  reviewed.  Constitutional:      Appearance: She is well-developed.  Neck:     Musculoskeletal: Normal range of motion.  Pulmonary:     Effort: Pulmonary effort is normal.  Abdominal:     Tenderness: There is abdominal tenderness.     Comments: Tender across lower abdomen. No CVA tenderness. No guarding or rebound.   Musculoskeletal: Normal range of motion.  Skin:    General: Skin is warm and dry.  Neurological:     Mental Status: She is alert and oriented to person, place, and time.      ED Treatments / Results  Labs (all labs ordered are listed, but only abnormal results are displayed) Labs Reviewed  LIPASE, BLOOD  COMPREHENSIVE METABOLIC PANEL  CBC  URINALYSIS, ROUTINE W REFLEX MICROSCOPIC  I-STAT BETA HCG BLOOD, ED ([facility], [facility], [facility] ONLY)   Results for orders placed or performed during the hospital encounter of 01/25/19  Lipase, blood  Result Value Ref Range   Lipase 35 11 - 51 U/L  Comprehensive metabolic panel  Result Value Ref Range   Sodium 138 135 - 145 mmol/L   Potassium 4.2 3.5 - 5.1 mmol/L   Chloride 104 98 - 111 mmol/L   CO2 24 22 - 32 mmol/L   Glucose, Bld 88 70 - 99 mg/dL   BUN 14 6 - 20 mg/dL   Creatinine, Ser 0.89 0.44 - 1.00 mg/dL   Calcium 9.2 8.9 - 10.3 mg/dL   Total Protein 7.4 6.5 - 8.1 g/dL   Albumin 3.9 3.5 - 5.0 g/dL   AST 18 15 - 41 U/L   ALT 20 0 - 44 U/L   Alkaline Phosphatase 47 38 - 126 U/L   Total Bilirubin 0.4 0.3 - 1.2 mg/dL   GFR calc non Af Amer >60 >60 mL/min   GFR calc Af Amer >60 >60 mL/min   Anion gap 10 5 - 15  CBC  Result Value Ref Range   WBC 6.6 4.0 - 10.5 K/uL   RBC 4.25 3.87 - 5.11 MIL/uL   Hemoglobin 13.7 12.0 - 15.0 g/dL   HCT 40.7 36.0 - 46.0 %   MCV 95.8 80.0 - 100.0 fL   MCH 32.2 26.0 - 34.0 pg   MCHC 33.7 30.0 - 36.0 g/dL   RDW 12.5 11.5 - 15.5 %   Platelets 177 150 - 400 K/uL   nRBC 0.0 0.0 - 0.2 %  Urinalysis, Routine w reflex microscopic  Result Value Ref Range   Color, Urine YELLOW YELLOW   APPearance  CLEAR CLEAR  Specific Gravity, Urine 1.013 1.005 - 1.030   pH 5.0 5.0 - 8.0   Glucose, UA NEGATIVE NEGATIVE mg/dL   Hgb urine dipstick NEGATIVE NEGATIVE   Bilirubin Urine NEGATIVE NEGATIVE   Ketones, ur NEGATIVE NEGATIVE mg/dL   Protein, ur NEGATIVE NEGATIVE mg/dL   Nitrite NEGATIVE NEGATIVE   Leukocytes,Ua NEGATIVE NEGATIVE  I-Stat beta hCG blood, ED  Result Value Ref Range   I-stat hCG, quantitative <5.0 <5 mIU/mL   Comment 3            EKG None  Radiology No results found.  Procedures Procedures (including critical care time)  Medications Ordered in ED Medications  sodium chloride flush (NS) 0.9 % injection 3 mL (has no administration in time range)     Initial Impression / Assessment and Plan / ED Course  I have reviewed the triage vital signs and the nursing notes.  Pertinent labs & imaging results that were available during my care of the patient were reviewed by me and considered in my medical decision making (see chart for details).        Patient to ED with lower abdominal pain radiating to left flank. No history of stones. No fever.   The patient is very well appearing. She has generalized lower abdominal tenderness. Labs are essentially unremarkable. Plain film shows large stool burden, no obstruction.   She has been treated for a UTI. No evidence of UTI now. Doubt kidney stone. No fever, leukocytosis - doubt acute infection or inflammation. Suspect constipation.   Return precautions discussed. Miralax, stool softener, high fiber diet.    Final Clinical Impressions(s) / ED Diagnoses   Final diagnoses:  None   1. Abdominal pain 2. Constipation  ED Discharge Orders    None       Charlann Lange, Hershal Coria 01/25/19 1504    Julianne Rice, MD 01/25/19 1659

## 2019-01-25 NOTE — ED Triage Notes (Signed)
Pt states that she is having left sided abd/flank pain. Pt is c/o kidney stones and constipation- pt was sent from [facility]. Pt has been having difficulties with BM. Last one yesterday, but only if she drinks cranberry juice. UA at clinic had small amount of blood in it.

## 2019-01-25 NOTE — Discharge Instructions (Addendum)
Use Miralax as directed. Drink plenty of water. Follow a high fiber diet.   Follow up with your doctor for recheck if symptoms persist in 4-5 days.

## 2019-02-15 ENCOUNTER — Emergency Department (HOSPITAL_COMMUNITY)
Admission: EM | Admit: 2019-02-15 | Discharge: 2019-02-15 | Disposition: A | Discharge status: Home / Self Care | Payer: Self-pay | Attending: Emergency Medicine | Admitting: Emergency Medicine

## 2019-02-15 ENCOUNTER — Encounter (HOSPITAL_COMMUNITY): Payer: Self-pay | Admitting: *Deleted

## 2019-02-15 ENCOUNTER — Other Ambulatory Visit: Payer: Self-pay

## 2019-02-15 DIAGNOSIS — Z79899 Other long term (current) drug therapy: Secondary | ICD-10-CM | POA: Insufficient documentation

## 2019-02-15 DIAGNOSIS — Z91040 Latex allergy status: Secondary | ICD-10-CM | POA: Insufficient documentation

## 2019-02-15 DIAGNOSIS — K644 Residual hemorrhoidal skin tags: Secondary | ICD-10-CM | POA: Insufficient documentation

## 2019-02-15 DIAGNOSIS — K602 Anal fissure, unspecified: Secondary | ICD-10-CM | POA: Insufficient documentation

## 2019-02-15 DIAGNOSIS — J45909 Unspecified asthma, uncomplicated: Secondary | ICD-10-CM | POA: Insufficient documentation

## 2019-02-15 MED ORDER — LIDOCAINE-PRILOCAINE 2.5-2.5 % EX CREA: 1.0000 "application " | TOPICAL_CREAM | CUTANEOUS | 0 refills | Status: DC | PRN

## 2019-02-15 MED ORDER — NITROGLYCERIN 0.4 % RE OINT: 1.0000 [in_us] | TOPICAL_OINTMENT | Freq: Two times a day (BID) | RECTAL | 0 refills | Status: DC

## 2019-02-15 NOTE — ED Notes (Signed)
ED Provider at bedside. 

## 2019-02-15 NOTE — ED Provider Notes (Signed)
[facility] DEPT Provider Note   CSN: [id number] Arrival date & time: 02/15/19  [id number]     History   Chief Complaint Chief Complaint  Patient presents with  . Hemorrhoids    HPI Shelby Jackson Jackson is a 28 y.o. female.     28yo female with complaint of a painful hemorrhoid for several days, trying warm water soaks, witch hazel pads, cortisone cream, stool softeners without relief. Patient went to UC yesterday, was tested for herpes and started on an antiviral, was advised to get a topical pain reliever that she has been unable to find.     Past Medical History:  Diagnosis Date  . Abnormal Pap smear of cervix 2009  . Anxiety   . Asthma   . Depression   . Elevated serum creatinine   . Fibroids   . H/O chlamydia infection 07/2012  . UTI (lower urinary tract infection)     Patient Active Problem List   Diagnosis Date Noted  . Gastroesophageal reflux disease without esophagitis 09/26/2018  . Leg mass, bilateral 04/19/2018  . Mild persistent asthma 04/19/2018  . Eczema 04/19/2018  . Elevated serum creatinine 04/19/2018    History reviewed. No pertinent surgical history.   OB History    Gravida  0   Para  0   Term  0   Preterm  0   AB  0   Living  0     SAB  0   TAB  0   Ectopic  0   Multiple  0   Live Births  0            Home Medications    Prior to Admission medications   Medication Sig Start Date End Date Taking? Authorizing Provider  acetaminophen (TYLENOL) 500 MG tablet Take 1,000 mg by mouth every 6 (six) hours as needed for moderate pain.    [provider]  beclomethasone (QVAR) 80 MCG/ACT inhaler Inhale 1 puff into the lungs 2 (two) times daily. 09/26/18   Shelby Barrack, MD  cetirizine (ZYRTEC) 10 MG tablet Take 10 mg by mouth daily as needed for allergies.    [provider]  ipratropium (ATROVENT) 0.06 % nasal spray Place 2 sprays into both nostrils 4 (four) times daily. Patient  taking differently: Place 2 sprays into both nostrils daily as needed for rhinitis.  09/17/18   Shelby Jackson Leigh, MD  lidocaine-prilocaine (EMLA) cream Apply 1 application topically as needed. 02/15/19   Shelby Learn, PA-C  nitrofurantoin, macrocrystal-monohydrate, (MACROBID) 100 MG capsule Take 1 capsule (100 mg total) by mouth 2 (two) times daily. Patient not taking: Reported on 01/25/2019 01/14/19   Shelby Jackson Dessert, MD  Nitroglycerin 0.4 % OINT Place 1 inch rectally 2 (two) times daily. 02/15/19   Shelby Learn, PA-C  pantoprazole (PROTONIX) 40 MG tablet TAKE 1 TABLET BY MOUTH DAILY AS NEEDED Patient taking differently: Take 40 mg by mouth daily as needed (acid reflux).  12/23/18   Shelby Barrack, MD  polyethylene glycol (MIRALAX) 17 g packet Use 17 gm dose three times daily, with a stool softener, until bowels clear, maximum of 3 consecutive days. 01/25/19   Shelby Jackson Lange, PA-C  predniSONE (STERAPRED UNI-PAK 21 TAB) 10 MG (21) TBPK tablet Take by mouth daily. Take 6 tabs by mouth daily  for 2 days, then 5 tabs for 2 days, then 4 tabs for 2 days, then 3 tabs for 2 days, 2 tabs for 2 days, then 1 tab  by mouth daily for 2 days Patient not taking: Reported on 01/25/2019 09/17/18   Shelby Jackson Leigh, MD  triamcinolone cream (KENALOG) 0.1 % Apply 1 application topically 2 (two) times daily. 04/19/18   Shelby Barrack, MD    Family History Family History  Problem Relation Age of Onset  . Anxiety disorder Father   . Diabetes Father   . Heart disease Father   . Arthritis Father   . Hypothyroidism Paternal Aunt   . Heart disease Maternal Grandfather   . Hypothyroidism Sister     Social History Social History   Tobacco Use  . Smoking status: Never Smoker  . Smokeless tobacco: Never Used  Substance Use Topics  . Alcohol use: No  . Drug use: No     Allergies   Pineapple, Albuterol, Bactrim [sulfamethoxazole-trimethoprim], Caffeine, Sulfa antibiotics, and Latex   Review of Systems Review of  Systems  Constitutional: Negative for fever.  Gastrointestinal: Positive for rectal pain. Negative for abdominal pain and anal bleeding.  Genitourinary: Negative for pelvic pain and vaginal discharge.  Skin: Negative for wound.  Allergic/Immunologic: Negative for immunocompromised state.  Neurological: Negative for weakness.  Hematological: Negative for adenopathy.  Psychiatric/Behavioral: Negative for confusion.  All other systems reviewed and are negative.    Physical Exam Updated Vital Signs BP (!) 133/101 (BP Location: Left Arm)   Pulse 84   Temp 98.7 F (37.1 C) (Oral)   Resp 18   Ht 5' (1.524 m)   Wt 98.9 kg   LMP 01/26/2019   SpO2 92%   BMI 42.58 kg/m   Physical Exam Vitals signs and nursing note reviewed. Exam conducted with a chaperone present.  Constitutional:      General: She is not in acute distress.    Appearance: She is well-developed. She is not diaphoretic.  HENT:     Head: Normocephalic and atraumatic.  Pulmonary:     Effort: Pulmonary effort is normal.  Genitourinary:   Skin:    General: Skin is warm and dry.  Neurological:     Mental Status: She is alert and oriented to person, place, and time.  Psychiatric:        Behavior: Behavior normal.      ED Treatments / Results  Labs (all labs ordered are listed, but only abnormal results are displayed) Labs Reviewed - No data to display  EKG None  Radiology No results found.  Procedures Procedures (including critical care time)  Medications Ordered in ED Medications - No data to display   Initial Impression / Assessment and Plan / ED Course  I have reviewed the triage vital signs and the nursing notes.  Pertinent labs & imaging results that were available during my care of the patient were reviewed by me and considered in my medical decision making (see chart for details).  Clinical Course as of Feb 14 1006  Sat Feb 14, 7294  1768 28 year old female with complaint of painful  hemorrhoids.  On exam patient has 1 small tender nonthrombosed external hemorrhoid, second area of fissure versus small ulcerated lesion.  Patient is Artie been tested for herpes, waiting test results.  Patient is taking antivirals currently, given prescription for topical cream to apply for pain, if not improving can try limited amount of nitro ointment to area.   [LM]    Clinical Course User Index [LM] Shelby Learn, PA-C      Final Clinical Impressions(s) / ED Diagnoses   Final diagnoses:  External hemorrhoids  Anal fissure  ED Discharge Orders         Ordered    Nitroglycerin 0.4 % OINT  2 times daily     02/15/19 0912    lidocaine-prilocaine (EMLA) cream  As needed     02/15/19 0912           Shelby Learn, PA-C 02/15/19 1008    Shelby Jackson Starch, MD 02/16/19 [phone]

## 2019-02-15 NOTE — ED Triage Notes (Signed)
Pt reports hemorrhoid for the past 5 days. Pt has tried warm baths, hydrocortisone cream, witch hazel, and tylenol for pain.

## 2019-02-15 NOTE — Discharge Instructions (Signed)
Anal fissure vs herpes. Continue to use current regimen for hemorrhoids to help keep stools soft. Motrin/Tylenol as needed for pain. Apply EMLA cream to area for pain relief. If NOT improving, apply nitro to rectal area (apply with a gloved finger and wash hands after applying). Follow up with your doctor for recheck, follow up with urgent care for results. Continue antiviral as prescribed by UC.

## 2019-03-13 ENCOUNTER — Ambulatory Visit: Payer: BLUE CROSS/BLUE SHIELD | Admitting: Obstetrics and Gynecology

## 2019-04-01 ENCOUNTER — Other Ambulatory Visit: Payer: Self-pay

## 2019-04-01 MED ORDER — PANTOPRAZOLE SODIUM 40 MG PO TBEC: 40.0000 mg | DELAYED_RELEASE_TABLET | Freq: Every day | ORAL | 0 refills | Status: AC | PRN

## 2019-06-28 ENCOUNTER — Emergency Department (HOSPITAL_COMMUNITY): Payer: Self-pay

## 2019-06-28 ENCOUNTER — Other Ambulatory Visit: Payer: Self-pay

## 2019-06-28 ENCOUNTER — Emergency Department (HOSPITAL_COMMUNITY)
Admission: EM | Admit: 2019-06-28 | Discharge: 2019-06-29 | Disposition: A | Discharge status: Home / Self Care | Payer: Self-pay | Attending: Emergency Medicine | Admitting: Emergency Medicine

## 2019-06-28 ENCOUNTER — Encounter (HOSPITAL_COMMUNITY): Payer: Self-pay | Admitting: Emergency Medicine

## 2019-06-28 DIAGNOSIS — R0600 Dyspnea, unspecified: Secondary | ICD-10-CM | POA: Insufficient documentation

## 2019-06-28 DIAGNOSIS — Z5321 Procedure and treatment not carried out due to patient leaving prior to being seen by health care provider: Secondary | ICD-10-CM | POA: Insufficient documentation

## 2019-06-28 HISTORY — DX: Bronchitis, not specified as acute or chronic: J40

## 2019-06-28 NOTE — ED Triage Notes (Signed)
Per pt, states sold symptoms for 1 week-states she has bronchitis-has been using her inhaler but is now having dyspnea when walking up stairs-states she has been on prednisone in past with a positive results

## 2019-06-28 NOTE — ED Notes (Signed)
Pt told registration that she was leaving.  

## 2019-07-01 ENCOUNTER — Other Ambulatory Visit: Payer: Self-pay

## 2019-07-01 ENCOUNTER — Emergency Department (HOSPITAL_COMMUNITY): Payer: Self-pay

## 2019-07-01 ENCOUNTER — Encounter (HOSPITAL_COMMUNITY): Payer: Self-pay | Admitting: Family Medicine

## 2019-07-01 DIAGNOSIS — Z79899 Other long term (current) drug therapy: Secondary | ICD-10-CM | POA: Insufficient documentation

## 2019-07-01 DIAGNOSIS — Z91040 Latex allergy status: Secondary | ICD-10-CM | POA: Insufficient documentation

## 2019-07-01 DIAGNOSIS — U071 COVID-19: Secondary | ICD-10-CM | POA: Insufficient documentation

## 2019-07-01 DIAGNOSIS — J45909 Unspecified asthma, uncomplicated: Secondary | ICD-10-CM | POA: Insufficient documentation

## 2019-07-01 MED ORDER — ACETAMINOPHEN 325 MG PO TABS
650.0000 mg | ORAL_TABLET | Freq: Once | ORAL | Status: AC | PRN
  Administered 2019-07-01: 650 mg via ORAL
  Filled 2019-07-01: qty 2

## 2019-07-01 NOTE — ED Triage Notes (Signed)
Patient states she was confirmed COVID positive on Sunday that was performed by [facility]. Patient reports she has a history asthma and having shortness of breath and chest pain. Patient is ambulatory and appears in on acute respiratory distress.

## 2019-07-02 ENCOUNTER — Emergency Department (HOSPITAL_COMMUNITY)
Admission: EM | Admit: 2019-07-02 | Discharge: 2019-07-02 | Disposition: A | Discharge status: Home / Self Care | Payer: Self-pay | Attending: Emergency Medicine | Admitting: Emergency Medicine

## 2019-07-02 DIAGNOSIS — U071 COVID-19: Secondary | ICD-10-CM

## 2019-07-02 MED ORDER — DEXAMETHASONE 4 MG PO TABS
6.0000 mg | ORAL_TABLET | Freq: Once | ORAL | Status: AC
  Administered 2019-07-02: 6 mg via ORAL
  Filled 2019-07-02: qty 1

## 2019-07-02 MED ORDER — DEXAMETHASONE 6 MG PO TABS: 6.0000 mg | ORAL_TABLET | Freq: Every day | ORAL | 0 refills | Status: DC

## 2019-07-02 NOTE — ED Provider Notes (Signed)
[facility] DEPT Provider Note   CSN: [id number] Arrival date & time: 07/01/19  1952     History Chief Complaint  Patient presents with  . COVID POSITIVE  . SHOB    Shelby Jackson is a 29 y.o. female.   29 year old female with a history of anxiety, asthma presents to the emergency department for shortness of breath and chest pain.  She has been experiencing shortness of breath over the past few days.  Was seen at [facility] 2 days ago and had a Covid test performed which returned positive.  This morning, she began to notice some sharp pains in her sternal region.  This is aggravated with coughing as well as deep breathing.  She has been taking Tylenol for symptoms without relief.  Notes fever as well as associated body aches.  Feels slightly winded with exertion.  Has continued use of her albuterol inhalers with mild, temporary relief.  Has not required use of her inhaler since this afternoon.  Denies syncope, hemoptysis, leg swelling.  The history is provided by the patient. No language interpreter was used.      Past Medical History:  Diagnosis Date  . Abnormal Pap smear of cervix 2009  . Anxiety   . Asthma   . Bronchitis   . Depression   . Elevated serum creatinine   . Fibroids   . H/O chlamydia infection 07/2012  . UTI (lower urinary tract infection)     Patient Active Problem List   Diagnosis Date Noted  . Gastroesophageal reflux disease without esophagitis 09/26/2018  . Leg mass, bilateral 04/19/2018  . Mild persistent asthma 04/19/2018  . Eczema 04/19/2018  . Elevated serum creatinine 04/19/2018    History reviewed. No pertinent surgical history.   OB History    Gravida  0   Para  0   Term  0   Preterm  0   AB  0   Living  0     SAB  0   TAB  0   Ectopic  0   Multiple  0   Live Births  0           Family History  Problem Relation Age of Onset  . Anxiety disorder Father   . Diabetes Father   . Heart  disease Father   . Arthritis Father   . Hypothyroidism Paternal Aunt   . Heart disease Maternal Grandfather   . Hypothyroidism Sister     Social History   Tobacco Use  . Smoking status: Never Smoker  . Smokeless tobacco: Never Used  Substance Use Topics  . Alcohol use: No  . Drug use: No    Home Medications Prior to Admission medications   Medication Sig Start Date End Date Taking? Authorizing Provider  acetaminophen (TYLENOL) 500 MG tablet Take 1,000 mg by mouth every 6 (six) hours as needed for moderate pain.    [provider]  beclomethasone (QVAR) 80 MCG/ACT inhaler Inhale 1 puff into the lungs 2 (two) times daily. 09/26/18   Vivi Barrack, MD  cetirizine (ZYRTEC) 10 MG tablet Take 10 mg by mouth daily as needed for allergies.    [provider]  dexamethasone (DECADRON) 6 MG tablet Take 1 tablet (6 mg total) by mouth daily. 07/02/19   Antonietta Breach, PA-C  ipratropium (ATROVENT) 0.06 % nasal spray Place 2 sprays into both nostrils 4 (four) times daily. Patient taking differently: Place 2 sprays into both nostrils daily as needed for  rhinitis.  09/17/18   Lacretia Leigh, MD  lidocaine-prilocaine (EMLA) cream Apply 1 application topically as needed. 02/15/19   Tacy Learn, PA-C  nitrofurantoin, macrocrystal-monohydrate, (MACROBID) 100 MG capsule Take 1 capsule (100 mg total) by mouth 2 (two) times daily. Patient not taking: Reported on 01/25/2019 01/14/19   Blanchie Dessert, MD  Nitroglycerin 0.4 % OINT Place 1 inch rectally 2 (two) times daily. 02/15/19   Tacy Learn, PA-C  pantoprazole (PROTONIX) 40 MG tablet Take 1 tablet (40 mg total) by mouth daily as needed. 04/01/19   Vivi Barrack, MD  polyethylene glycol (MIRALAX) 17 g packet Use 17 gm dose three times daily, with a stool softener, until bowels clear, maximum of 3 consecutive days. 01/25/19   Charlann Lange, PA-C  triamcinolone cream (KENALOG) 0.1 % Apply 1 application topically 2 (two) times daily.  04/19/18   Vivi Barrack, MD    Allergies    Pineapple, Albuterol, Bactrim [sulfamethoxazole-trimethoprim], Caffeine, Sulfa antibiotics, and Latex  Review of Systems   Review of Systems  Ten systems reviewed and are negative for acute change, except as noted in the HPI.    Physical Exam Updated Vital Signs BP 126/81   Pulse 84   Temp 99.8 F (37.7 C) (Oral)   Resp 19   Ht 5' (1.524 m)   Wt 97.5 kg   LMP 06/09/2019   SpO2 97%   BMI 41.99 kg/m   Physical Exam Vitals and nursing note reviewed.  Constitutional:      General: She is not in acute distress.    Appearance: She is well-developed. She is not diaphoretic.     Comments: Obese AA female. Patient in NAD.  HENT:     Head: Normocephalic and atraumatic.  Eyes:     General: No scleral icterus.    Conjunctiva/sclera: Conjunctivae normal.  Cardiovascular:     Rate and Rhythm: Normal rate and regular rhythm.     Pulses: Normal pulses.     Comments: Not tachycardic as noted in triage Pulmonary:     Effort: Pulmonary effort is normal. No respiratory distress.     Breath sounds: No stridor. No wheezing or rales.     Comments: No tachypnea or dyspnea.  Lungs are grossly clear bilaterally.  No wheezes or rales.  Chest expansion symmetric. Musculoskeletal:        General: Normal range of motion.     Cervical back: Normal range of motion.  Skin:    General: Skin is warm and dry.     Coloration: Skin is not pale.     Findings: No erythema or rash.  Neurological:     General: No focal deficit present.     Mental Status: She is alert and oriented to person, place, and time.     Coordination: Coordination normal.  Psychiatric:        Behavior: Behavior normal.     ED Results / Procedures / Treatments   Labs (all labs ordered are listed, but only abnormal results are displayed) Labs Reviewed - No data to display  EKG None  Radiology DG Chest 2 View  Result Date: 07/01/2019 CLINICAL DATA:  29 year old female  with shortness of breath. EXAM: CHEST - 2 VIEW COMPARISON:  Chest radiograph dated 06/28/2019. FINDINGS: Shallow inspiration. Minimal bilateral mid to lower lung field densities may represent atelectasis. Developing infiltrate is less likely but not excluded. Clinical correlation is recommended. No focal consolidation, pleural effusion, pneumothorax. The cardiac silhouette is within normal limits. No  acute osseous pathology. IMPRESSION: No focal consolidation. Electronically Signed   By: Anner Crete M.D.   On: 07/01/2019 21:05    Procedures Procedures (including critical care time)  Medications Ordered in ED Medications  acetaminophen (TYLENOL) tablet 650 mg (650 mg Oral Given 07/01/19 2049)  dexamethasone (DECADRON) tablet 6 mg (6 mg Oral Given 07/02/19 0126)    ED Course  I have reviewed the triage vital signs and the nursing notes.  Pertinent labs & imaging results that were available during my care of the patient were reviewed by me and considered in my medical decision making (see chart for details).  12:45 AM Patient with sats at or above 97% with ambulation. Lungs CTAB at bedside.    MDM Rules/Calculators/A&P                      29 year old female with a history of asthma presenting for chest pain.  Suspect pain to be secondary to costochondritis as it is aggravated with coughing and deep breathing.  She had a fever on arrival with tachycardia.  Vital signs have improved following antipyretics.  Despite complaints of shortness of breath, the patient maintains oxygen saturations of 97% or above.  This is also true with ambulation.  Her lung sounds are clear.  Chest x-ray suggestive of atelectasis versus findings consistent with coronavirus.  She tested positive for Covid 2 days ago.  Plan for discharge on 5-day burst of Decadron.  She has been encouraged to follow-up with her primary care doctor.  No indication for further emergent work-up or admission at this time.  Patient  discharged in stable condition with no unaddressed concerns.  Shelby Jackson was evaluated in Emergency Department on 07/02/2019 for the symptoms described in the history of present illness. She was evaluated in the context of the global COVID-19 pandemic, which necessitated consideration that the patient might be at risk for infection with the SARS-CoV-2 virus that causes COVID-19. Institutional protocols and algorithms that pertain to the evaluation of patients at risk for COVID-19 are in a state of rapid change based on information released by regulatory bodies including the CDC and federal and state organizations. These policies and algorithms were followed during the patient's care in the ED.   Final Clinical Impression(s) / ED Diagnoses Final diagnoses:  [id number] virus infection    Rx / DC Orders ED Discharge Orders         Ordered    dexamethasone (DECADRON) 6 MG tablet  Daily     07/02/19 0150           Antonietta Breach, PA-C 07/02/19 0255    Palumbo, April, MD 07/02/19 [id number]

## 2019-07-02 NOTE — Discharge Instructions (Signed)
Continue using 2 puffs of your albuterol inhaler every 4-6 hours for management of wheezing, shortness of breath.  You have been started on Decadron.  Take as prescribed until finished.  Use 650 mg Tylenol every 6 hours for management of fever.  You may take ibuprofen intermittently for body aches, if desired.  Follow-up with your primary care doctor to ensure resolution of symptoms.

## 2019-07-02 NOTE — ED Notes (Signed)
PT ambulated while monitoring O2 levels. Pt was able to maintain a 97% or higher oxygen saturation. Pt had some notable shortness of breath but was not in respiratory distress while walking.

## 2019-08-25 ENCOUNTER — Encounter: Payer: Self-pay | Admitting: Certified Nurse Midwife

## 2019-08-27 ENCOUNTER — Encounter: Payer: Self-pay | Admitting: Certified Nurse Midwife

## 2020-02-17 NOTE — Progress Notes (Signed)
29 y.o. G0P0000 Single African American female here for annual exam.    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  Went to urgent care for the dx.  No recurrent sores.   She would like STD testing.   Now dating.  Declines contraception at this time.  She will see her PCP for evaluation of anemia.  She feels really tired.  Patient has known fibroids.  Stopped Micronor due to mood swings and leg pain.  Her last menstrual was heavy.   Had Covid in January.  She was vaccinated since then.   Will finish school in about 1 year.  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  PCP:   Dimas Chyle, MD   Patient's last menstrual period was 02/09/2020.     Period Cycle (Days): 30 Period Duration (Days): 5 Period Pattern: Regular Menstrual Flow: Moderate, Heavy Menstrual Control: Maxi pad Menstrual Control Change Freq (Hours): changes pad 4 times a day Dysmenorrhea: (!) Severe Dysmenorrhea Symptoms: Cramping, Nausea, Other (Comment) (back pain)     Sexually active: No.  The current method of family planning is abstinence Exercising: Yes.    walking Smoker:  no  Health Maintenance: Pap: 03-07-18 Neg:Neg HR HPV, 11/2014 normal per patient History of abnormal Pap:  Yes, 2009--F/u pap normal. MMG: 01-14-10 Lt.Br.US/Neg/BiRads1 Colonoscopy:  n/a BMD:   n/a  Result  n/a TDaP:  01-02-17 Gardasil:   yes HIV:03-07-18 NR Hep C:03-07-18 Neg Screening Labs:  Hb today: discuss with provider, Urine today: unable to void at this time   reports that she has never smoked. She has never used smokeless tobacco. She reports that she does not drink alcohol and does not use drugs.  Past Medical History:  Diagnosis Date  . Abnormal Pap smear of cervix 2009  . Anxiety   . Asthma   . Bronchitis   . Depression   . Elevated serum creatinine   . Fibroids   . H/O chlamydia infection 07/2012  . UTI (lower urinary tract  infection)     No past surgical history on file.  Current Outpatient Medications  Medication Sig Dispense Refill  . acetaminophen (TYLENOL) 500 MG tablet Take 1,000 mg by mouth every 6 (six) hours as needed for moderate pain.    . beclomethasone (QVAR) 80 MCG/ACT inhaler Inhale 1 puff into the lungs 2 (two) times daily. 1 Inhaler 12  . cetirizine (ZYRTEC) 10 MG tablet Take 10 mg by mouth daily as needed for allergies.    Marland Kitchen dexamethasone (DECADRON) 6 MG tablet Take 1 tablet (6 mg total) by mouth daily. 5 tablet 0  . ipratropium (ATROVENT) 0.06 % nasal spray Place 2 sprays into both nostrils 4 (four) times daily. (Patient taking differently: Place 2 sprays into both nostrils daily as needed for rhinitis. ) 15 mL 0  . lidocaine-prilocaine (EMLA) cream Apply 1 application topically as needed. 30 g 0  . Nitroglycerin 0.4 % OINT Place 1 inch rectally 2 (two) times daily. 30 g 0  . pantoprazole (PROTONIX) 40 MG tablet Take 1 tablet (40 mg total) by mouth daily as needed. 90 tablet 0  . polyethylene glycol (MIRALAX) 17 g packet Use 17 gm dose three times daily, with a stool softener, until bowels clear, maximum of 3 consecutive days. 9 each 0  . triamcinolone cream (KENALOG) 0.1 % Apply 1 application topically 2 (two) times daily. 80 g 1  No current facility-administered medications for this visit.    Family History  Problem Relation Age of Onset  . Anxiety disorder Father   . Diabetes Father   . Heart disease Father   . Arthritis Father   . Hypothyroidism Paternal Aunt   . Heart disease Maternal Grandfather   . Hypothyroidism Sister     Review of Systems  Genitourinary: Positive for frequency.  All other systems reviewed and are negative. Denies dysuria.   Exam:   BP 110/68 (BP Location: Right Arm, Patient Position: Sitting, Cuff Size: Large)   Pulse 78   Resp 14   Ht 5' (1.524 m)   Wt 226 lb (102.5 kg)   LMP 02/09/2020   BMI 44.14 kg/m     General appearance: alert,  cooperative and appears stated age Head: normocephalic, without obvious abnormality, atraumatic Neck: no adenopathy, supple, symmetrical, trachea midline and thyroid normal to inspection and palpation Lungs: clear to auscultation bilaterally Breasts: normal appearance, no masses or tenderness, No nipple retraction or dimpling, No nipple discharge or bleeding, No axillary adenopathy Heart: regular rate and rhythm Abdomen: soft, non-tender; no masses, no organomegaly Extremities: extremities normal, atraumatic, no cyanosis or edema Skin: skin color, texture, turgor normal. No rashes or lesions Lymph nodes: cervical, supraclavicular, and axillary nodes normal. Neurologic: grossly normal  Pelvic: External genitalia:  no lesions              No abnormal inguinal nodes palpated.              Urethra:  normal appearing urethra with no masses, tenderness or lesions              Bartholins and Skenes: normal                 Vagina: normal appearing vagina with normal color and discharge, no lesions              Cervix: no lesions              Pap taken: No. Bimanual Exam:  Uterus:  normal size, contour, position, consistency, mobility, non-tender              Adnexa: no mass, fullness, tenderness    Chaperone was present for exam.  Assessment:   Well woman visit with normal exam. Fibroids.  Fatigue.  STD screening.  Plan: Mammogram screening discussed. Self breast awareness reviewed. Pap and HR HPV as above. Guidelines for Calcium, Vitamin D, regular exercise program including cardiovascular and weight bearing exercise. STD testing, CBC, iron, ferritin. Follow up annually and prn.    After visit summary provided.

## 2020-02-18 ENCOUNTER — Ambulatory Visit: Payer: BC Managed Care – PPO | Admitting: Obstetrics and Gynecology

## 2020-02-18 ENCOUNTER — Encounter: Payer: Self-pay | Admitting: Obstetrics and Gynecology

## 2020-02-18 ENCOUNTER — Other Ambulatory Visit (HOSPITAL_COMMUNITY)
Admission: RE | Admit: 2020-02-18 | Discharge: 2020-02-18 | Disposition: A | Discharge status: Home / Self Care | Payer: BC Managed Care – PPO | Source: Ambulatory Visit | Attending: Obstetrics and Gynecology | Admitting: Obstetrics and Gynecology

## 2020-02-18 ENCOUNTER — Other Ambulatory Visit: Payer: Self-pay

## 2020-02-18 VITALS — BP 110/68 | HR 78 | Resp 14 | Ht 60.0 in | Wt 226.0 lb

## 2020-02-18 DIAGNOSIS — Z01419 Encounter for gynecological examination (general) (routine) without abnormal findings: Secondary | ICD-10-CM

## 2020-02-18 DIAGNOSIS — Z113 Encounter for screening for infections with a predominantly sexual mode of transmission: Secondary | ICD-10-CM

## 2020-02-18 NOTE — Patient Instructions (Signed)

## 2020-02-19 ENCOUNTER — Telehealth: Payer: Self-pay | Admitting: Obstetrics and Gynecology

## 2020-02-19 LAB — CBC
Hematocrit: 40 % (ref 34.0–46.6)
Hemoglobin: 13.8 g/dL (ref 11.1–15.9)
MCH: 32.7 pg (ref 26.6–33.0)
MCHC: 34.5 g/dL (ref 31.5–35.7)
MCV: 95 fL (ref 79–97)
Platelets: 189 10*3/uL (ref 150–450)
RBC: 4.22 x10E6/uL (ref 3.77–5.28)
RDW: 12.9 % (ref 11.7–15.4)
WBC: 6.5 10*3/uL (ref 3.4–10.8)

## 2020-02-19 LAB — CERVICOVAGINAL ANCILLARY ONLY
Chlamydia: NEGATIVE
Comment: NEGATIVE
Comment: NEGATIVE
Comment: NORMAL
Neisseria Gonorrhea: NEGATIVE
Trichomonas: NEGATIVE

## 2020-02-19 LAB — HSV(HERPES SIMPLEX VRS) I + II AB-IGG
HSV 1 Glycoprotein G Ab, IgG: 4.65 index — ABNORMAL HIGH (ref 0.00–0.90)
HSV 2 IgG, Type Spec: <0.91 index (ref 0.00–0.90)

## 2020-02-19 LAB — HEPATITIS C ANTIBODY: Hep C Virus Ab: <0.1 s/co ratio (ref 0.0–0.9)

## 2020-02-19 LAB — HEP, RPR, HIV PANEL
HIV Screen 4th Generation wRfx: NONREACTIVE
Hepatitis B Surface Ag: NEGATIVE
RPR Ser Ql: NONREACTIVE

## 2020-02-19 LAB — FERRITIN: Ferritin: 29 ng/mL (ref 15–150)

## 2020-02-19 LAB — IRON: Iron: 91 ug/dL (ref 27–159)

## 2020-02-19 NOTE — Telephone Encounter (Signed)
Patient calling to discuss her test results.

## 2020-02-19 NOTE — Telephone Encounter (Signed)
Routing to Dr Quincy Simmonds for results and recommendations from labs on 02/18/20.

## 2020-02-20 ENCOUNTER — Encounter: Payer: Self-pay | Admitting: Obstetrics and Gynecology

## 2020-02-20 NOTE — Telephone Encounter (Signed)
Lyndon Code D  P [facility] Clinical Pool Given that the result was Positive and the number range is very high. What type of medication do I need to be prescribed and what can I do in the present to keep my partner safe?    Routing to Dr. Quincy Simmonds to review and advise.

## 2020-02-20 NOTE — Telephone Encounter (Signed)
Message sent to patient through My Chart.  I recommend an office visit to discuss antiviral treatment.

## 2020-02-20 NOTE — Telephone Encounter (Signed)
See 02/19/20 telephone encounter.   Encounter closed.

## 2020-02-23 NOTE — Telephone Encounter (Signed)
Results Seen by patient Shelby Jackson on 02/21/2020 7:58 AM.

## 2020-02-23 NOTE — Telephone Encounter (Signed)
Spoke with patient. Offered OV to further discuss results, patient request to schedule.  OV scheduled for 8/15 at 4pm with Dr. Quincy Simmonds.   Encounter closed.

## 2020-02-25 ENCOUNTER — Encounter: Payer: Self-pay | Admitting: Obstetrics and Gynecology

## 2020-02-25 ENCOUNTER — Ambulatory Visit: Payer: BC Managed Care – PPO | Admitting: Obstetrics and Gynecology

## 2020-02-25 ENCOUNTER — Other Ambulatory Visit: Payer: Self-pay

## 2020-02-25 VITALS — BP 118/72 | HR 64 | Ht 60.0 in | Wt 227.0 lb

## 2020-02-25 DIAGNOSIS — B009 Herpesviral infection, unspecified: Secondary | ICD-10-CM | POA: Diagnosis not present

## 2020-02-25 NOTE — Progress Notes (Signed)
GYNECOLOGY  VISIT   HPI: 29 y.o.   Single  African American  female   [place] with Patient's last menstrual period was 02/09/2020.   here for consult to discuss lab results.     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  All other STD testing was negative.   Patient had swollen lymph nodes and a slight fever, in addition to small crater like lesions outside the vagina in July 2020.  She was seen at urgent care in [place].  She had HSV testing from the area, and the result was inconclusive.  She was treated at the time with Valtrex for 10 days.  She tolerated the Valtrex well.  The lesions went away.   She has not had any recurrence of this.   Patient denies hx of cold sores but does have acne above her lip.   She may have had an anal outbreak in the past.   GYNECOLOGIC HISTORY: Patient's last menstrual period was 02/09/2020. Contraception: Abstinence  Menopausal hormone therapy:  none Last mammogram:  n/a Last pap smear:  03-07-18 Neg:Neg HR HPV, 11/2014 normal per patient        OB History    Gravida  0   Para  0   Term  0   Preterm  0   AB  0   Living  0     SAB  0   TAB  0   Ectopic  0   Multiple  0   Live Births  0              Patient Active Problem List   Diagnosis Date Noted  . Gastroesophageal reflux disease without esophagitis 09/26/2018  . Leg mass, bilateral 04/19/2018  . Mild persistent asthma 04/19/2018  . Eczema 04/19/2018  . Elevated serum creatinine 04/19/2018    Past Medical History:  Diagnosis Date  . Abnormal Pap smear of cervix 2009  . Anxiety   . Asthma   . Bronchitis   . Depression   . Elevated serum creatinine   . Fibroids   . H/O chlamydia infection 07/2012  . UTI (lower urinary tract infection)     History reviewed. No pertinent surgical history.  Current Outpatient Medications  Medication Sig Dispense Refill  . acetaminophen (TYLENOL) 500 MG tablet Take 1,000 mg by mouth  every 6 (six) hours as needed for moderate pain.    . beclomethasone (QVAR) 80 MCG/ACT inhaler Inhale 1 puff into the lungs 2 (two) times daily. 1 Inhaler 12  . cetirizine (ZYRTEC) 10 MG tablet Take 10 mg by mouth daily as needed for allergies.    Marland Kitchen dexamethasone (DECADRON) 6 MG tablet Take 1 tablet (6 mg total) by mouth daily. 5 tablet 0  . ipratropium (ATROVENT) 0.06 % nasal spray Place 2 sprays into both nostrils 4 (four) times daily. (Patient taking differently: Place 2 sprays into both nostrils daily as needed for rhinitis. ) 15 mL 0  . Multiple Vitamin (MULTIVITAMIN WITH MINERALS) TABS tablet Take 1 tablet by mouth daily.    . Nitroglycerin 0.4 % OINT Place 1 inch rectally 2 (two) times daily. 30 g 0  . pantoprazole (PROTONIX) 40 MG tablet Take 1 tablet (40 mg total) by mouth daily as needed. 90 tablet 0  . polyethylene glycol (MIRALAX) 17 g packet Use 17 gm dose three times daily, with a stool softener, until bowels clear, maximum of 3 consecutive days. 9 each 0  .  triamcinolone cream (KENALOG) 0.1 % Apply 1 application topically 2 (two) times daily. 80 g 1   No current facility-administered medications for this visit.     ALLERGIES: Pineapple, Albuterol, Bactrim [sulfamethoxazole-trimethoprim], Caffeine, Sulfa antibiotics, and Latex  Family History  Problem Relation Age of Onset  . Anxiety disorder Father   . Diabetes Father   . Heart disease Father   . Arthritis Father   . Hypothyroidism Paternal Aunt   . Heart disease Maternal Grandfather   . Hypothyroidism Sister     Social History   Socioeconomic History  . Marital status: Single    Spouse name: Not on file  . Number of children: Not on file  . Years of education: Not on file  . Highest education level: Not on file  Occupational History  . Not on file  Tobacco Use  . Smoking status: Never Smoker  . Smokeless tobacco: Never Used  Vaping Use  . Vaping Use: Never used  Substance and Sexual Activity  . Alcohol use:  No  . Drug use: No  . Sexual activity: Yes    Birth control/protection: Condom  Other Topics Concern  . Not on file  Social History Narrative  . Not on file   Social Determinants of Health   Financial Resource Strain:   . Difficulty of Paying Living Expenses: Not on file  Food Insecurity:   . Worried About Charity fundraiser in the Last Year: Not on file  . Ran Out of Food in the Last Year: Not on file  Transportation Needs:   . Lack of Transportation (Medical): Not on file  . Lack of Transportation (Non-Medical): Not on file  Physical Activity:   . Days of Exercise per Week: Not on file  . Minutes of Exercise per Session: Not on file  Stress:   . Feeling of Stress : Not on file  Social Connections:   . Frequency of Communication with Friends and Family: Not on file  . Frequency of Social Gatherings with Friends and Family: Not on file  . Attends Religious Services: Not on file  . Active Member of Clubs or Organizations: Not on file  . Attends Archivist Meetings: Not on file  . Marital Status: Not on file  Intimate Partner Violence:   . Fear of Current or Ex-Partner: Not on file  . Emotionally Abused: Not on file  . Physically Abused: Not on file  . Sexually Abused: Not on file    Review of Systems  All other systems reviewed and are negative.   PHYSICAL EXAMINATION:    BP 118/72 (Cuff Size: Large)   Pulse 64   Ht 5' (1.524 m)   Wt 227 lb (103 kg)   LMP 02/09/2020   BMI 44.33 kg/m     General appearance: alert, cooperative and appears stated age   ASSESSMENT  Positive HSV I antibodies.  Hx mildly elevated creatinine many years ago.  Her last creatinine in Epic is 0.89 on 01/25/19.  PLAN  We discussed HSV I and II in detail, route of transmission, condoms for prevention, asymptomatic shedding, antiviral therapy, and need for Cesarean Section for an outbreak at the time of labor and delivery.  Will check BMP.  Will likely initiate Valtrex 500 mg  po daily and increase to bid x 3 days for an outbreak.  She will call if she had an outbreak in the genital area, she will return for re-testing. Fu prn.

## 2020-02-26 LAB — BASIC METABOLIC PANEL
BUN/Creatinine Ratio: 12 (ref 9–23)
BUN: 14 mg/dL (ref 6–20)
CO2: 24 mmol/L (ref 20–29)
Calcium: 8.9 mg/dL (ref 8.7–10.2)
Chloride: 104 mmol/L (ref 96–106)
Creatinine, Ser: 1.16 mg/dL — ABNORMAL HIGH (ref 0.57–1.00)
GFR calc Af Amer: 74 mL/min/{1.73_m2} (ref >59)
GFR calc non Af Amer: 64 mL/min/{1.73_m2} (ref >59)
Glucose: 86 mg/dL (ref 65–99)
Potassium: 4.3 mmol/L (ref 3.5–5.2)
Sodium: 140 mmol/L (ref 134–144)

## 2020-03-01 ENCOUNTER — Other Ambulatory Visit: Payer: Self-pay | Admitting: *Deleted

## 2020-03-01 MED ORDER — VALACYCLOVIR HCL 500 MG PO TABS: 500.0000 mg | ORAL_TABLET | Freq: Every day | ORAL | 3 refills | Status: DC

## 2020-03-02 ENCOUNTER — Other Ambulatory Visit: Payer: Self-pay

## 2020-03-02 ENCOUNTER — Encounter: Payer: Self-pay | Admitting: Family Medicine

## 2020-03-02 ENCOUNTER — Ambulatory Visit: Payer: BC Managed Care – PPO | Admitting: Family Medicine

## 2020-03-02 VITALS — BP 107/76 | HR 77 | Temp 97.7°F | Ht 60.0 in | Wt 228.4 lb

## 2020-03-02 DIAGNOSIS — J4530 Mild persistent asthma, uncomplicated: Secondary | ICD-10-CM | POA: Diagnosis not present

## 2020-03-02 DIAGNOSIS — J302 Other seasonal allergic rhinitis: Secondary | ICD-10-CM | POA: Diagnosis not present

## 2020-03-02 DIAGNOSIS — R7989 Other specified abnormal findings of blood chemistry: Secondary | ICD-10-CM | POA: Diagnosis not present

## 2020-03-02 MED ORDER — MONTELUKAST SODIUM 10 MG PO TABS: 10.0000 mg | ORAL_TABLET | Freq: Every day | ORAL | 3 refills | Status: DC

## 2020-03-02 NOTE — Assessment & Plan Note (Signed)
Likely due to dehydration.  She will come back within 1 to 2 weeks to recheck be met.

## 2020-03-02 NOTE — Patient Instructions (Signed)
It was very nice to see you today!  Please try taking the singulair. Let me know how it is working for you in a few weeks.  Please make sure that you are getting plenty of fluids.  We should have you come back in 1 to 2 weeks to recheck your kidney numbers.  Take care, Dr Jerline Pain  Please try these tips to maintain a healthy lifestyle:   Eat at least 3 REAL meals and 1-2 snacks per day.  Aim for no more than 5 hours between eating.  If you eat breakfast, please do so within one hour of getting up.    Each meal should contain half fruits/vegetables, one quarter protein, and one quarter carbs (no bigger than a computer mouse)   Cut down on sweet beverages. This includes juice, soda, and sweet tea.     Drink at least 1 glass of water with each meal and aim for at least 8 glasses per day   Exercise at least 150 minutes every week.

## 2020-03-02 NOTE — Assessment & Plan Note (Signed)
Stable.  Continue Qvar twice daily.  Should hopefully have some improvement with Singulair as above as well.

## 2020-03-02 NOTE — Progress Notes (Signed)
   Shelby Jackson is a 29 y.o. female who presents today for an office visit.  Assessment/Plan:  Chronic Problems Addressed Today: Seasonal allergies Symptoms are not controlled.  She has tried over-the-counter meds with no improvement she has also tried Atrovent.  We will add on Singulair today.  If no improvement would consider adding on Dymista or Astelin.  May need referral to allergy if still no improvement.  Elevated serum creatinine Likely due to dehydration.  She will come back within 1 to 2 weeks to recheck be met.  Mild persistent asthma Stable.  Continue Qvar twice daily.  Should hopefully have some improvement with Singulair as above as well.    Subjective:  HPI:  N/A.        Objective:  Physical Exam: BP 107/76   Pulse 77   Temp 97.7 F (36.5 C) (Temporal)   Ht 5' (1.524 m)   Wt 228 lb 6.4 oz (103.6 kg)   LMP 02/09/2020   SpO2 99%   BMI 44.61 kg/m   Gen: No acute distress, resting comfortably CV: Regular rate and rhythm with no murmurs appreciated Pulm: Normal work of breathing, clear to auscultation bilaterally with no crackles, wheezes, or rhonchi Neuro: Grossly normal, moves all extremities Psych: Normal affect and thought content      Shelby Jackson M. Jerline Pain, MD 03/02/2020 8:28 AM

## 2020-03-02 NOTE — Assessment & Plan Note (Signed)
Symptoms are not controlled.  She has tried over-the-counter meds with no improvement she has also tried Atrovent.  We will add on Singulair today.  If no improvement would consider adding on Dymista or Astelin.  May need referral to allergy if still no improvement.

## 2020-03-09 NOTE — Progress Notes (Signed)
GYNECOLOGY  VISIT   HPI: 29 y.o.   Single  African American  female   [place] with Patient's last menstrual period was 03/03/2020.   here to discuss birth control options.    Micronor caused leg cramps and mood swings in the past.   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   She did have an outbreak of HSV on her bottom.   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   She has started Valtrex on 02/26/20 for prevention.  She increased the dosage for 3 days to treat the outbreak.   GYNECOLOGIC HISTORY: Patient's last menstrual period was 03/03/2020. Contraception: Condoms Menopausal hormone therapy:  none Last mammogram:  n/a Last pap smear:   03-07-18 Neg:Neg HR HPV, 11/2014 normal per patient        OB History    Gravida  0   Para  0   Term  0   Preterm  0   AB  0   Living  0     SAB  0   TAB  0   Ectopic  0   Multiple  0   Live Births  0              Patient Active Problem List   Diagnosis Date Noted  . Seasonal allergies 03/02/2020  . Gastroesophageal reflux disease without esophagitis 09/26/2018  . Leg mass, bilateral 04/19/2018  . Mild persistent asthma 04/19/2018  . Eczema 04/19/2018  . Elevated serum creatinine 04/19/2018    Past Medical History:  Diagnosis Date  . Abnormal Pap smear of cervix 2009  . Anxiety   . Asthma   . Bronchitis   . 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 Depression   . Elevated serum creatinine   . Fibroids   . H/O chlamydia infection 07/2012  . UTI (lower urinary tract infection)     History reviewed. No pertinent surgical history.  Current Outpatient Medications  Medication Sig Dispense Refill  . acetaminophen (TYLENOL) 500 MG tablet Take 1,000 mg by mouth every 6 (six) hours as needed for moderate pain.    . beclomethasone (QVAR) 80 MCG/ACT inhaler Inhale 1 puff into the lungs 2 (two) times daily. 1 Inhaler 12  . cetirizine (ZYRTEC) 10 MG  tablet Take 10 mg by mouth daily as needed for allergies.    Marland Kitchen ipratropium (ATROVENT) 0.06 % nasal spray Place 2 sprays into both nostrils 4 (four) times daily. (Patient taking differently: Place 2 sprays into both nostrils daily as needed for rhinitis. ) 15 mL 0  . montelukast (SINGULAIR) 10 MG tablet Take 1 tablet (10 mg total) by mouth at bedtime. 90 tablet 3  . Multiple Vitamin (MULTIVITAMIN WITH MINERALS) TABS tablet Take 1 tablet by mouth daily.    . Nitroglycerin 0.4 % OINT Place 1 inch rectally 2 (two) times daily. 30 g 0  . pantoprazole (PROTONIX) 40 MG tablet Take 1 tablet (40 mg total) by mouth daily as needed. 90 tablet 0  . polyethylene glycol (MIRALAX) 17 g packet Use 17 gm dose three times daily, with a stool softener, until bowels clear, maximum of 3 consecutive days. 9 each 0  . triamcinolone cream (KENALOG) 0.1 % Apply 1 application topically 2 (two) times daily. 80 g 1  . valACYclovir (VALTREX) 500 MG tablet Take 1 tablet (500 mg total) by mouth daily. Increase to 1 tablet bid for 3 days for an outbreak. [place]iano  tablet 3   No current facility-administered medications for this visit.     ALLERGIES: Pineapple, Albuterol, Bactrim [sulfamethoxazole-trimethoprim], Caffeine, Sulfa antibiotics, and Latex  Family History  Problem Relation Age of Onset  . Anxiety disorder Father   . Diabetes Father   . Heart disease Father   . Arthritis Father   . Hypothyroidism Paternal Aunt   . Heart disease Maternal Grandfather   . Hypothyroidism Sister     Social History   Socioeconomic History  . Marital status: Single    Spouse name: Not on file  . Number of children: Not on file  . Years of education: Not on file  . Highest education level: Not on file  Occupational History  . Not on file  Tobacco Use  . Smoking status: Never Smoker  . Smokeless tobacco: Never Used  Vaping Use  . Vaping Use: Never used  Substance and Sexual Activity  . Alcohol use: No  . Drug use: No  . Sexual  activity: Yes    Birth control/protection: Condom  Other Topics Concern  . Not on file  Social History Narrative  . Not on file   Social Determinants of Health   Financial Resource Strain:   . Difficulty of Paying Living Expenses: Not on file  Food Insecurity:   . Worried About Charity fundraiser in the Last Year: Not on file  . Ran Out of Food in the Last Year: Not on file  Transportation Needs:   . Lack of Transportation (Medical): Not on file  . Lack of Transportation (Non-Medical): Not on file  Physical Activity:   . Days of Exercise per Week: Not on file  . Minutes of Exercise per Session: Not on file  Stress:   . Feeling of Stress : Not on file  Social Connections:   . Frequency of Communication with Friends and Family: Not on file  . Frequency of Social Gatherings with Friends and Family: Not on file  . Attends Religious Services: Not on file  . Active Member of Clubs or Organizations: Not on file  . Attends Archivist Meetings: Not on file  . Marital Status: Not on file  Intimate Partner Violence:   . Fear of Current or Ex-Partner: Not on file  . Emotionally Abused: Not on file  . Physically Abused: Not on file  . Sexually Abused: Not on file    Review of Systems  Constitutional: Negative.   HENT: Negative.   Eyes: Negative.   Respiratory: Negative.   Cardiovascular: Negative.   Gastrointestinal: Negative.   Endocrine: Negative.   Genitourinary: Negative.   Musculoskeletal: Negative.   Skin: Negative.   Allergic/Immunologic: Negative.   Neurological: Negative.   Hematological: Negative.   Psychiatric/Behavioral: Negative.     PHYSICAL EXAMINATION:    BP 112/76 (BP Location: Right Arm, Patient Position: Sitting, Cuff Size: Large)   Pulse 72   Resp 14   Ht 5' (1.524 m)   Wt 235 lb (106.6 kg)   LMP 03/03/2020   BMI 45.90 kg/m     General appearance: alert, cooperative and appears stated age   ASSESSMENT  Contraception counseling.   Chronic superficial venous thrombosis of both lower extremities.  Intolerance to Micronor. Hx fibroids. Hx heavy and painful menses. HSV I with outbreaks.  Slightly elevated creatinine.  PLAN  We reviewed contraception options for her that are non estrogen containing:  POPs, Depo Provera, Nexplanon, progesterone IUDs, ParaGard IUD, barrier methods.  We focused our discussion on  IUDs.  I reviewed her pelvic ultrasound in Epic and she has no distortion of the endometrium from her fibroids, so I think she is a candidate for an IUD.  Will precert Mirena IUD.  Risks and benefits reviewed.  Plan for Cytotec 200 mcg pv at hs night before procedure an am of procedure.  Paracervical block planned as well.  Continue Valtrex 500 mg daily and increase to bid x 3 days for an outbreak.

## 2020-03-10 ENCOUNTER — Ambulatory Visit (INDEPENDENT_AMBULATORY_CARE_PROVIDER_SITE_OTHER): Payer: BC Managed Care – PPO | Admitting: Obstetrics and Gynecology

## 2020-03-10 ENCOUNTER — Other Ambulatory Visit: Payer: Self-pay

## 2020-03-10 ENCOUNTER — Encounter: Payer: Self-pay | Admitting: Obstetrics and Gynecology

## 2020-03-10 VITALS — BP 112/76 | HR 72 | Resp 14 | Ht 60.0 in | Wt 235.0 lb

## 2020-03-10 DIAGNOSIS — Z3009 Encounter for other general counseling and advice on contraception: Secondary | ICD-10-CM

## 2020-03-10 DIAGNOSIS — B009 Herpesviral infection, unspecified: Secondary | ICD-10-CM

## 2020-03-12 ENCOUNTER — Telehealth: Payer: Self-pay

## 2020-03-12 MED ORDER — MISOPROSTOL 200 MCG PO TABS: ORAL_TABLET | ORAL | 0 refills | Status: DC

## 2020-03-12 NOTE — Telephone Encounter (Signed)
Spoke with patient, request to proceed with Mirena IUD insertion.  LMP 03/03/20 Last SA 03/09/20, condoms for contraceptive.  Reviewed 03/10/20 OV notes, will need cytotec and paracervical block.  Rx sent to pharmacy for cytotec. Patient read back instructions, request MyChart message with instructions. Message sent.   Advised patient to return call to office on first day of menses to schedule IUD insertion, patient agreeable.   Routing to provider for final review. Patient is agreeable to disposition. Will close encounter.

## 2020-03-12 NOTE — Telephone Encounter (Signed)
Call placed to convey benefits for Mirena insertion. Spoke with the patient and conveyed the benefits. Please call for scheduling.

## 2020-03-16 ENCOUNTER — Ambulatory Visit: Payer: BC Managed Care – PPO | Admitting: Family Medicine

## 2020-03-16 DIAGNOSIS — Z20828 Contact with and (suspected) exposure to other viral communicable diseases: Secondary | ICD-10-CM | POA: Diagnosis not present

## 2020-03-23 ENCOUNTER — Other Ambulatory Visit: Payer: BC Managed Care – PPO

## 2020-03-23 ENCOUNTER — Other Ambulatory Visit: Payer: Self-pay

## 2020-03-23 ENCOUNTER — Ambulatory Visit: Payer: BC Managed Care – PPO | Admitting: Family Medicine

## 2020-03-23 DIAGNOSIS — R7989 Other specified abnormal findings of blood chemistry: Secondary | ICD-10-CM | POA: Diagnosis not present

## 2020-03-23 NOTE — Progress Notes (Signed)
Erroneous encounter.  Shelby Jackson. Jerline Pain, MD 03/23/2020 12:13 PM

## 2020-03-24 LAB — BASIC METABOLIC PANEL
BUN: 14 mg/dL (ref 7–25)
CO2: 25 mmol/L (ref 20–32)
Calcium: 9.2 mg/dL (ref 8.6–10.2)
Chloride: 106 mmol/L (ref 98–110)
Creat: 0.95 mg/dL (ref 0.50–1.10)
Glucose, Bld: 106 mg/dL — ABNORMAL HIGH (ref 65–99)
Potassium: 4.4 mmol/L (ref 3.5–5.3)
Sodium: 139 mmol/L (ref 135–146)

## 2020-03-24 NOTE — Progress Notes (Signed)
Please inform patient of the following:  Good news! Kidney function is back to normal. She was probably dehydrated last time. Do not need to do any further testing at this point.  Algis Greenhouse. Jerline Pain, MD 03/24/2020 9:41 AM

## 2020-03-25 ENCOUNTER — Telehealth: Payer: Self-pay

## 2020-03-25 NOTE — Telephone Encounter (Signed)
LM for patient to call back for lab results

## 2020-05-22 IMAGING — US US EXTREM LOW*R* LIMITED
1 series · 14 of 17 positions shown · non-contrast
Comparison: None.

CLINICAL DATA: Chronic right anterior lower leg mass.

EXAM:
ULTRASOUND RIGHT LOWER EXTREMITY LIMITED
TECHNIQUE: Ultrasound examination of the lower extremity soft tissues was
performed in the area of clinical concern.

[Series 1: us extrem low*right* limited · 0.04mm/px · 17 acquisitions, 14 frames shown]
[im 1/17]
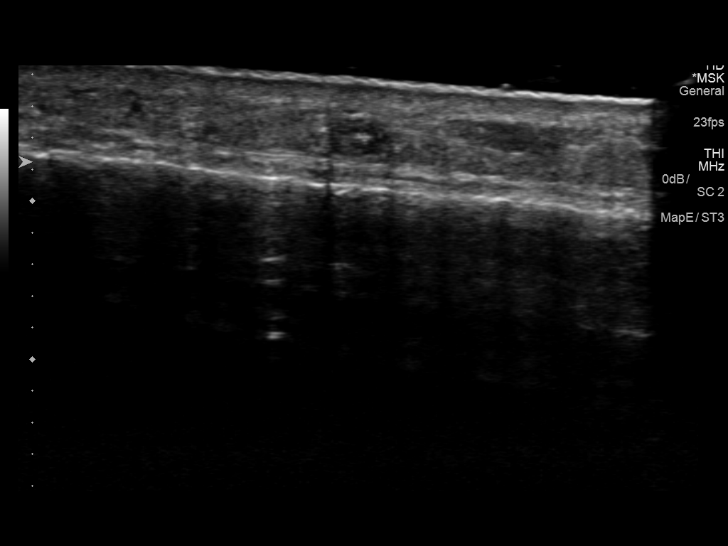
[im 2/17]
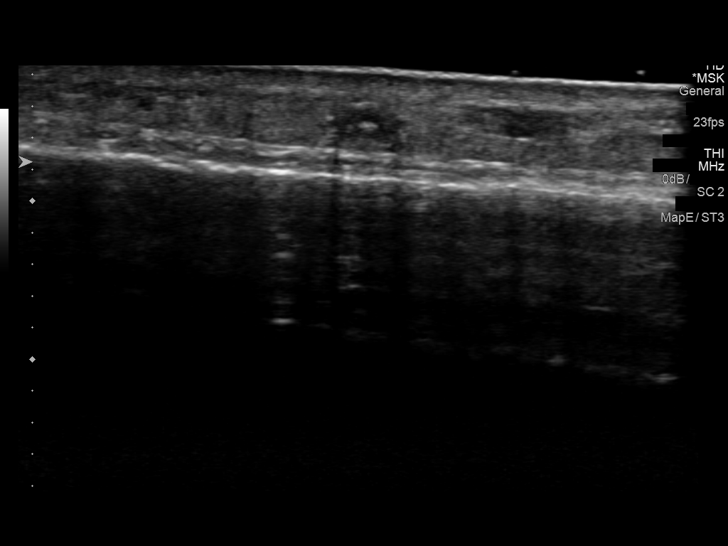
[im 4/17]
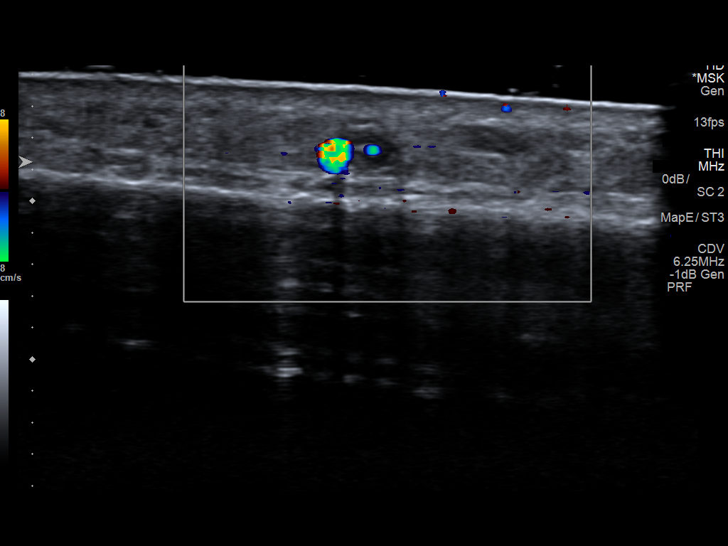
[im 5/17]
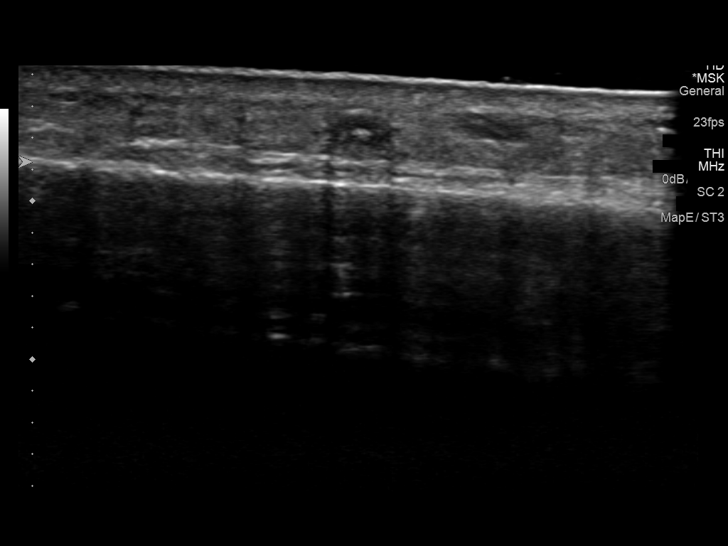
[im 6/17]
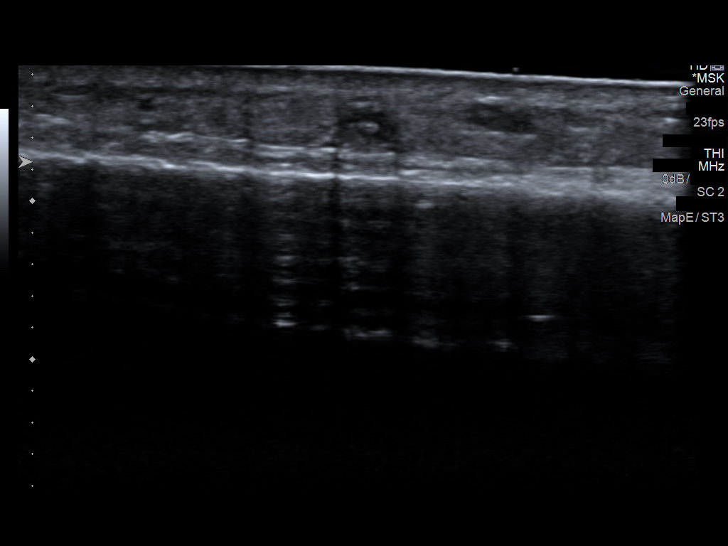
[im 7/17]
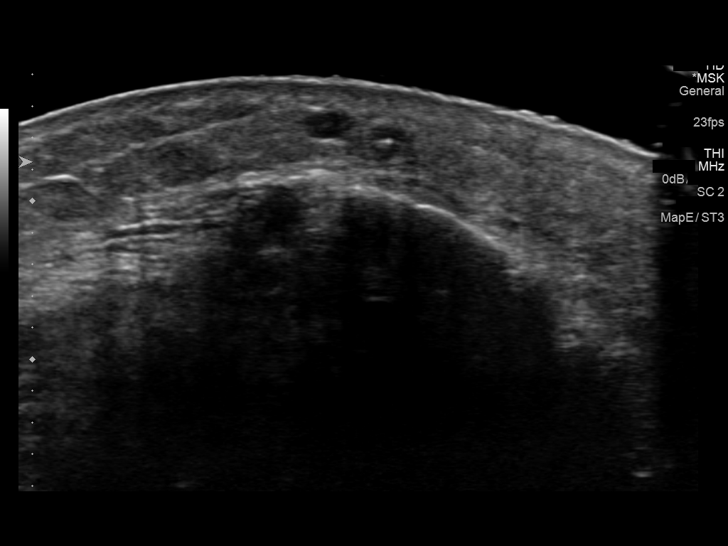
[im 8/17]
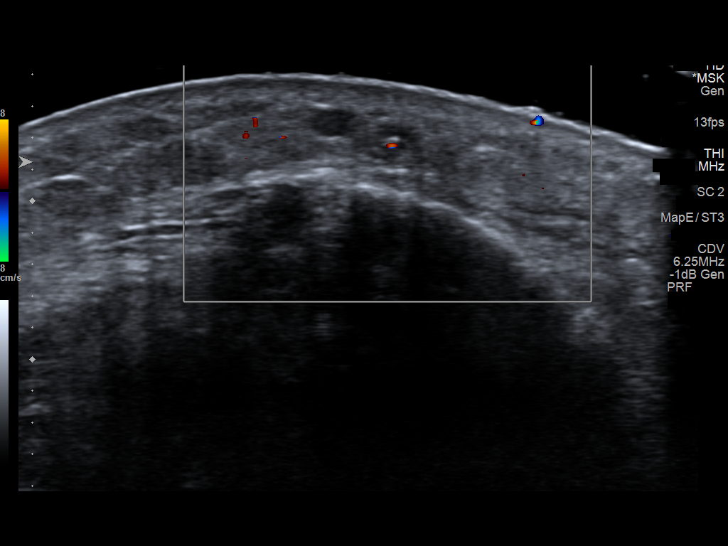
[im 10/17]
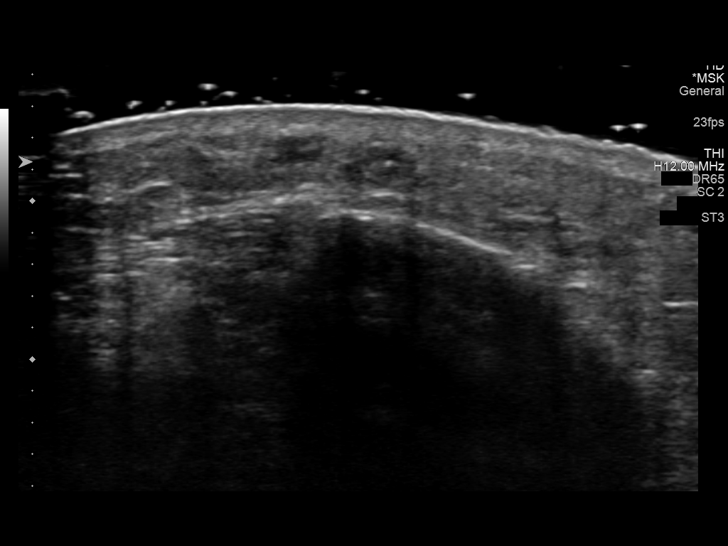
[im 11/17]
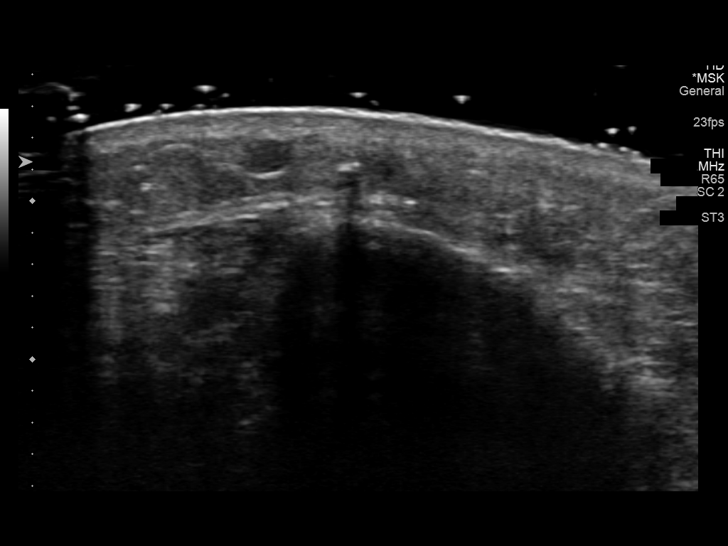
[im 12/17]
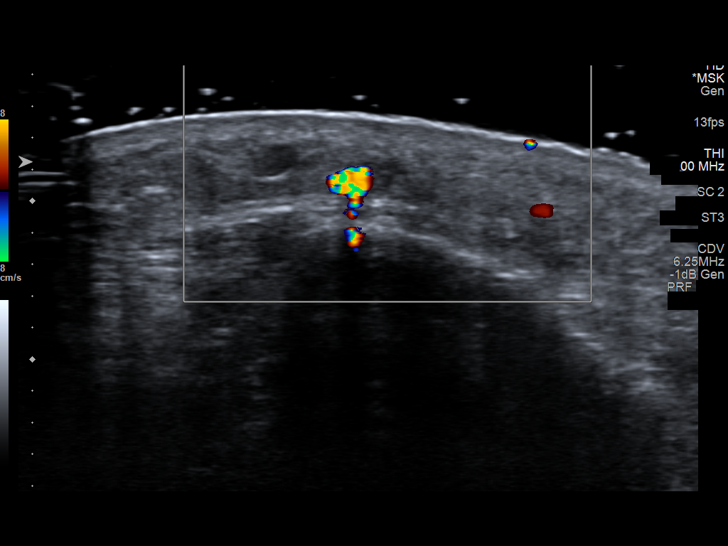
[im 13/17]
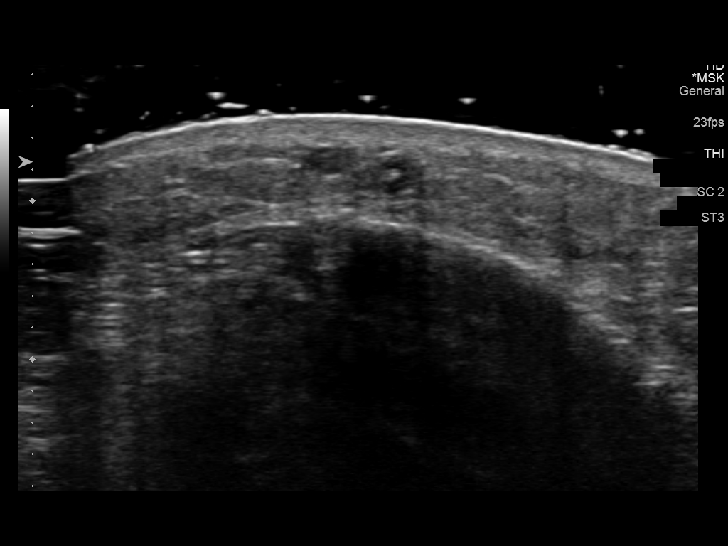
[im 14/17]
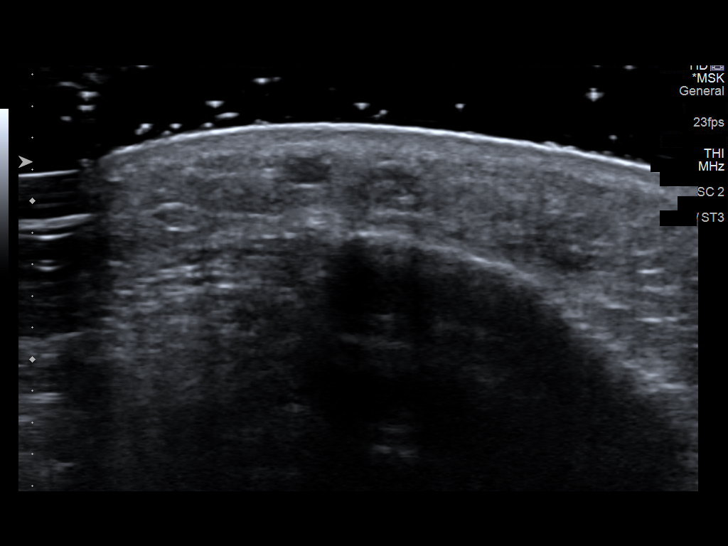
[im 16/17]
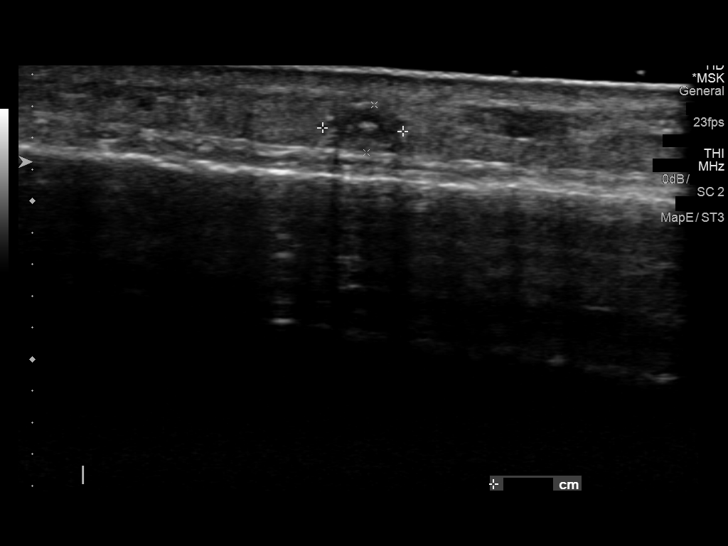
[im 17/17]
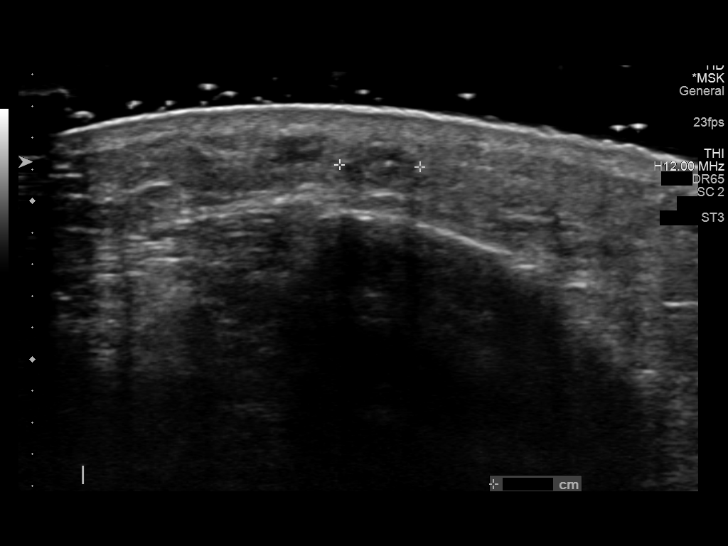

[14 of 17 positions shown; findings below may reference images not displayed]

FINDINGS: Focused ultrasound in the area of clinical concern along the distal
anterior lower leg demonstrates a 0.5 x 0.3 x 0.5 cm hypoechoic
lesion containing a central calcification, in close proximity to a
superficial vein. There is a partially hyperechoic rim which
demonstrates posterior acoustic shadowing.
IMPRESSION: 1. Superficial 5 mm hypoechoic lesion with central calcification in
the distal anterior lower leg, in close proximity to a superficial
vein. This could represent a small chronic superficial vein
thrombosis or an area of fat necrosis.

## 2020-06-16 DIAGNOSIS — G4489 Other headache syndrome: Secondary | ICD-10-CM | POA: Diagnosis not present

## 2020-06-16 DIAGNOSIS — R509 Fever, unspecified: Secondary | ICD-10-CM | POA: Diagnosis not present

## 2020-06-16 DIAGNOSIS — J3489 Other specified disorders of nose and nasal sinuses: Secondary | ICD-10-CM | POA: Diagnosis not present

## 2020-06-16 DIAGNOSIS — R059 Cough, unspecified: Secondary | ICD-10-CM | POA: Diagnosis not present

## 2020-07-15 ENCOUNTER — Encounter: Payer: Self-pay | Admitting: Family Medicine

## 2020-07-16 ENCOUNTER — Other Ambulatory Visit: Payer: Self-pay

## 2020-07-16 DIAGNOSIS — L853 Xerosis cutis: Secondary | ICD-10-CM

## 2020-07-20 NOTE — Progress Notes (Signed)
GYNECOLOGY  VISIT  CC:   Vaginal irritation/dysuria  HPI: 30 y.o. G0P0000 Single Black or African American female here for vaginal irritation, itching & dysuria.   Symptoms started with irritation in the vagina 2 weeks ago and not going way. She noticed symptoms 2 days after sex with boyfriend. Burning with urination started a week and a half ago. Has not tried any over the counter remedy. She is monogamous but would like STD testing. Denies abnormal discharge.   Reports pain in suprapubic area when bladder is full.  Pt is concerned because her period is so light, it is not usually like this and wants a pregnancy test to be safe.  GYNECOLOGIC HISTORY: Patient's last menstrual period was 07/21/2020 (exact date). Contraception: condoms   Patient Active Problem List   Diagnosis Date Noted  . Seasonal allergies 03/02/2020  . Gastroesophageal reflux disease without esophagitis 09/26/2018  . Leg mass, bilateral 04/19/2018  . Mild persistent asthma 04/19/2018  . Eczema 04/19/2018  . Elevated serum creatinine 04/19/2018    Past Medical History:  Diagnosis Date  . Abnormal Pap smear of cervix 2009  . Anxiety   . Asthma   . Bronchitis   . 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 Depression   . Elevated serum creatinine   . Fibroids   . H/O chlamydia infection 07/2012  . UTI (lower urinary tract infection)     History reviewed. No pertinent surgical history.  MEDS:   Current Outpatient Medications on File Prior to Visit  Medication Sig Dispense Refill  . acetaminophen (TYLENOL) 500 MG tablet Take 1,000 mg by mouth every 6 (six) hours as needed for moderate pain.    . beclomethasone (QVAR) 80 MCG/ACT inhaler Inhale 1 puff into the lungs 2 (two) times daily. 1 Inhaler 12  . cetirizine (ZYRTEC) 10 MG tablet Take 10 mg by mouth daily as needed for allergies.    Marland Kitchen ipratropium (ATROVENT) 0.06 % nasal spray Place 2 sprays into both nostrils 4 (four) times  daily. (Patient taking differently: Place 2 sprays into both nostrils daily as needed for rhinitis.) 15 mL 0  . montelukast (SINGULAIR) 10 MG tablet Take 1 tablet (10 mg total) by mouth at bedtime. 90 tablet 3  . Multiple Vitamin (MULTIVITAMIN WITH MINERALS) TABS tablet Take 1 tablet by mouth daily.    . pantoprazole (PROTONIX) 40 MG tablet Take 1 tablet (40 mg total) by mouth daily as needed. 90 tablet 0  . polyethylene glycol (MIRALAX) 17 g packet Use 17 gm dose three times daily, with a stool softener, until bowels clear, maximum of 3 consecutive days. 9 each 0  . valACYclovir (VALTREX) 500 MG tablet Take 1 tablet (500 mg total) by mouth daily. Increase to 1 tablet bid for 3 days for an outbreak. 110 tablet 3   No current facility-administered medications on file prior to visit.    ALLERGIES: Pineapple, Albuterol, Bactrim [sulfamethoxazole-trimethoprim], Caffeine, Sulfa antibiotics, and Latex  Family History  Problem Relation Age of Onset  . Anxiety disorder Father   . Diabetes Father   . Heart disease Father   . Arthritis Father   . Hypothyroidism Paternal Aunt   . Heart disease Maternal Grandfather   . Hypothyroidism Sister      Review of Systems  Constitutional: Negative.   HENT: Negative.   Eyes: Negative.   Respiratory: Negative.   Cardiovascular: Negative.   Gastrointestinal: Negative.   Endocrine: Negative.   Genitourinary: Positive for dysuria.  Vaginal irritation, vaginal itching  Musculoskeletal: Negative.   Skin: Negative.   Allergic/Immunologic: Negative.   Neurological: Negative.   Psychiatric/Behavioral: Negative.     PHYSICAL EXAMINATION:    BP 104/64   Pulse 70   Temp 98.3 F (36.8 C) (Oral)   Resp 16   Wt 230 lb (104.3 kg)   LMP 07/21/2020 (Exact Date)   BMI 44.92 kg/m     General appearance: alert, cooperative, no acute distress  Neg CVAT  Abdomen: soft, no masses,  no organomegaly, mild tenderness lower pelvic bilaterally Lymph:  no  inguinal LAD noted  Pelvic: External genitalia:  no lesions              Urethra:  normal appearing urethra with no masses, tenderness or lesions              Bartholins and Skenes: normal                 Vagina: normal appearing vagina with brown blood noted               Cervix: no cervical motion tenderness, no lesions and no redness              Bimanual Exam:  Uterus:  firm and mildly enlarged, mildly tender              Adnexa: no mass, fullness, tenderness               Chaperone, Joy, CMA, was present for exam.  Assessment/Plan: Dysuria - Plan: Urinalysis,Complete w/RFL Culture  BV (bacterial vaginosis) - Plan: WET PREP FOR TRICH, YEAST, CLUE, metroNIDAZOLE (FLAGYL) 500 MG tablet  Screen for STD (sexually transmitted disease) - Plan: C. trachomatis/N. gonorrhoeae RNA  Menstrual changes - Plan: Pregnancy, urine

## 2020-07-23 ENCOUNTER — Encounter: Payer: Self-pay | Admitting: Nurse Practitioner

## 2020-07-23 ENCOUNTER — Ambulatory Visit: Payer: BC Managed Care – PPO | Admitting: Nurse Practitioner

## 2020-07-23 ENCOUNTER — Other Ambulatory Visit: Payer: Self-pay

## 2020-07-23 VITALS — BP 104/64 | HR 70 | Temp 98.3°F | Resp 16 | Wt 230.0 lb

## 2020-07-23 DIAGNOSIS — R300 Dysuria: Secondary | ICD-10-CM | POA: Diagnosis not present

## 2020-07-23 DIAGNOSIS — N760 Acute vaginitis: Secondary | ICD-10-CM

## 2020-07-23 DIAGNOSIS — N926 Irregular menstruation, unspecified: Secondary | ICD-10-CM

## 2020-07-23 DIAGNOSIS — B9689 Other specified bacterial agents as the cause of diseases classified elsewhere: Secondary | ICD-10-CM

## 2020-07-23 DIAGNOSIS — Z113 Encounter for screening for infections with a predominantly sexual mode of transmission: Secondary | ICD-10-CM | POA: Diagnosis not present

## 2020-07-23 LAB — WET PREP FOR TRICH, YEAST, CLUE

## 2020-07-23 LAB — PREGNANCY, URINE: Preg Test, Ur: NEGATIVE

## 2020-07-23 MED ORDER — METRONIDAZOLE 500 MG PO TABS: 500.0000 mg | ORAL_TABLET | Freq: Two times a day (BID) | ORAL | 0 refills | Status: DC

## 2020-07-23 NOTE — Patient Instructions (Signed)
Bacterial Vaginosis  Bacterial vaginosis is an infection that occurs when the normal balance of bacteria in the vagina changes. This change is caused by an overgrowth of certain bacteria in the vagina. Bacterial vaginosis is the most common vaginal infection among females aged 31 to 101 years. This condition increases the risk of sexually transmitted infections (STIs). Treatment can help reduce this risk. Treatment is very important for pregnant women because this condition can cause babies to be born early (prematurely) or at a low birth weight. What are the causes? This condition is caused by an increase in harmful bacteria that are normally present in small amounts in the vagina. However, the exact reason this condition develops is not known. You cannot get bacterial vaginosis from toilet seats, bedding, swimming pools, or contact with objects around you. What increases the risk? The following factors may make you more likely to develop this condition:  Having a new sexual partner or multiple sexual partners, or having unprotected sex.  Douching.  Having an intrauterine device (IUD).  Smoking.  Abusing drugs and alcohol. This may lead to riskier sexual behavior.  Taking certain antibiotic medicines.  Being pregnant. What are the signs or symptoms? Some women with this condition have no symptoms. Symptoms may include:  Pearline Cables or white vaginal discharge. The discharge can be watery or foamy.  A fish-like odor with discharge, especially after sex or during menstruation.  Itching in and around the vagina.  Burning or pain with urination. How is this diagnosed? This condition is diagnosed based on:  Your medical history.  A physical exam of the vagina.  Checking a sample of vaginal fluid for harmful bacteria or abnormal cells. How is this treated? This condition is treated with antibiotic medicines. These may be given as a pill, a vaginal cream, or a medicine that is put into the  vagina (suppository). If the condition comes back after treatment, a second round of antibiotics may be needed. Follow these instructions at home: Medicines  Take or apply over-the-counter and prescription medicines only as told by your health care provider.  Take or apply your antibiotic medicine as told by your health care provider. Do not stop using the antibiotic even if you start to feel better. General instructions  If you have a female sexual partner, tell her that you have a vaginal infection. She should follow up with her health care provider. If you have a female sexual partner, he does not need treatment.  Avoid sexual activity until you finish treatment.  Drink enough fluid to keep your urine pale yellow.  Keep the area around your vagina and rectum clean. ? Wash the area daily with warm water. ? Wipe yourself from front to back after using the toilet.  If you are breastfeeding, talk to your health care provider about continuing breastfeeding during treatment.  Keep all follow-up visits. This is important. How is this prevented? Self-care  Do not douche.  Wash the outside of your vagina with warm water only.  Wear cotton or cotton-lined underwear.  Avoid wearing tight pants and pantyhose, especially during the summer. Safe sex  Use protection when having sex. This includes: ? Using condoms. ? Using dental dams. This is a thin layer of a material made of latex or polyurethane that protects the mouth during oral sex.  Limit the number of sexual partners. To help prevent bacterial vaginosis, it is best to have sex with just one partner (monogamous relationship).  Make sure you and your sexual partner  are tested for STIs. Drugs and alcohol  Do not use any products that contain nicotine or tobacco. These products include cigarettes, chewing tobacco, and vaping devices, such as e-cigarettes. If you need help quitting, ask your health care provider.  Do not use  drugs.  Do not drink alcohol if: ? Your health care provider tells you not to do this. ? You are pregnant, may be pregnant, or are planning to become pregnant.  If you drink alcohol: ? Limit how much you have to 0-1 drink a day. ? Be aware of how much alcohol is in your drink. In the [place] one drink equals one 12 oz bottle of beer (355 mL), one 5 oz glass of wine (148 mL), or one 1 oz glass of hard liquor (44 mL). Where to find more information  Centers for Disease Control and Prevention: http://www.wolf.info/  American Sexual Health Association (ASHA): www.ashastd.org  [place] Department of Health and Financial controller, Office on Women's Health: [place].tn Contact a health care provider if:  Your symptoms do not improve, even after treatment.  You have more discharge or pain when urinating.  You have a fever or chills.  You have pain in your abdomen or pelvis.  You have pain during sex.  You have vaginal bleeding between menstrual periods. Summary  Bacterial vaginosis is a vaginal infection that occurs when the normal balance of bacteria in the vagina changes. It results from an overgrowth of certain bacteria.  This condition increases the risk of sexually transmitted infections (STIs). Getting treated can help reduce this risk.  Treatment is very important for pregnant women because this condition can cause babies to be born early (prematurely) or at low birth weight.  This condition is treated with antibiotic medicines. These may be given as a pill, a vaginal cream, or a medicine that is put into the vagina (suppository). This information is not intended to replace advice given to you by your health care provider. Make sure you discuss any questions you have with your health care provider. Document Revised: 11/27/2019 Document Reviewed: 11/27/2019 Elsevier Patient Education  [place].

## 2020-07-26 LAB — URINALYSIS, COMPLETE W/RFL CULTURE
Bilirubin Urine: NEGATIVE
Glucose, UA: NEGATIVE
Hyaline Cast: NONE SEEN /LPF
Ketones, ur: NEGATIVE
Nitrites, Initial: NEGATIVE
Specific Gravity, Urine: 1.025 (ref 1.001–1.03)
pH: 5.5 (ref 5.0–8.0)

## 2020-07-26 LAB — URINE CULTURE
MICRO NUMBER:: 11524404
SPECIMEN QUALITY:: ADEQUATE

## 2020-07-26 LAB — CULTURE INDICATED

## 2020-07-27 LAB — C. TRACHOMATIS/N. GONORRHOEAE RNA
C. trachomatis RNA, TMA: NOT DETECTED
N. gonorrhoeae RNA, TMA: NOT DETECTED

## 2020-07-28 ENCOUNTER — Telehealth: Payer: Self-pay

## 2020-07-28 NOTE — Telephone Encounter (Signed)
Lets give it a couple days to see if this clears up since she just started metronidazole. If she is still symptomatic on Monday, she should call back. The burning with urination on the skin does not seem like bladder infection symptoms. Thank you

## 2020-07-28 NOTE — Telephone Encounter (Signed)
I spoke with patient. She only started taking the Metronidazole yesterday. She said still some slight burning and irritation. Upon questioning the burning she described is the external skin that it is irritated and when the urine hits it it burns.   She has no allergy to Amoxicillin.

## 2020-07-28 NOTE — Telephone Encounter (Signed)
-----   Message from Karma Ganja, NP sent at 07/27/2020  8:12 AM EST ----- Please call and notify neg Gonorrhea and Chlamydia results. Also, the urine culture identified a small amount of bacteria called Group B Strep. Usually this bacteria does not cause symptoms and typically in this very small amount, we usually do not treat unless the patient is symptomatic. She was symptomatic at the time of her visit, which I attributed possibly to [place] (which was treated). Can you ask the patient if she is still symptomatic for painful urination, and if so verify that she can tolerate Amoxicillin.

## 2020-07-29 NOTE — Telephone Encounter (Signed)
Patient informed.  Patient mentioned that her urine has become a dark golden color while taking the Metronidazole and asked is that normal.  I told her I had not heard that before but medications can cause urine to change color sometimes. She has no UTI sx. . Recommend plenty of water and avoid alcohol while taking this (she does not drink alcohol).  I told her to call if any UTI sx develop and also to call first of week if current ext burning is not better.   I told her I would let Claiborne Billings know about her urine.

## 2020-07-29 NOTE — Telephone Encounter (Signed)
Thank you for this message. The color of her urine means that she should increase water intake. That should make her feel better as well.

## 2020-08-13 NOTE — Telephone Encounter (Signed)
See note

## 2020-09-08 ENCOUNTER — Encounter: Payer: Self-pay | Admitting: Family Medicine

## 2020-09-08 ENCOUNTER — Other Ambulatory Visit: Payer: Self-pay

## 2020-09-08 ENCOUNTER — Ambulatory Visit: Payer: BC Managed Care – PPO | Admitting: Family Medicine

## 2020-09-08 VITALS — BP 103/71 | HR 73 | Temp 97.6°F | Ht 60.0 in | Wt 231.2 lb

## 2020-09-08 DIAGNOSIS — J302 Other seasonal allergic rhinitis: Secondary | ICD-10-CM

## 2020-09-08 DIAGNOSIS — G4730 Sleep apnea, unspecified: Secondary | ICD-10-CM

## 2020-09-08 DIAGNOSIS — J4530 Mild persistent asthma, uncomplicated: Secondary | ICD-10-CM

## 2020-09-08 DIAGNOSIS — G4733 Obstructive sleep apnea (adult) (pediatric): Secondary | ICD-10-CM | POA: Insufficient documentation

## 2020-09-08 MED ORDER — QVAR 80 MCG/ACT IN AERS: 1.0000 | INHALATION_SPRAY | Freq: Two times a day (BID) | RESPIRATORY_TRACT | 12 refills | Status: AC

## 2020-09-08 NOTE — Progress Notes (Signed)
   Shelby Jackson is a 30 y.o. female who presents today for an office visit.  Assessment/Plan:  Chronic Problems Addressed Today: Sleep-disordered breathing Patient with witnessed apneic episodes, unrestful sleep, daytime somnolence, and audible snoring.  Will place referral to sleep medicine for sleep study.  Seasonal allergies Not controlled on Atrovent, Singulair, and Zyrtec.  Will place referral to allergy for further management.  Mild persistent asthma Worsened recently most likely due to allergies and pollen. Will place referral to allergy as above.     Subjective:  HPI:  See A/p.         Objective:  Physical Exam: BP 103/71   Pulse 73   Temp 97.6 F (36.4 C) (Temporal)   Ht 5' (1.524 m)   Wt 231 lb 3.2 oz (104.9 kg)   SpO2 97%   BMI 45.15 kg/m   Gen: No acute distress, resting comfortably CV: Regular rate and rhythm with no murmurs appreciated Pulm: Normal work of breathing, clear to auscultation bilaterally with no crackles, wheezes, or rhonchi Neuro: Grossly normal, moves all extremities Psych: Normal affect and thought content      Shelby Demers M. Jerline Pain, MD 09/08/2020 8:37 AM

## 2020-09-08 NOTE — Assessment & Plan Note (Signed)
Worsened recently most likely due to allergies and pollen. Will place referral to allergy as above.

## 2020-09-08 NOTE — Assessment & Plan Note (Signed)
Not controlled on Atrovent, Singulair, and Zyrtec.  Will place referral to allergy for further management.

## 2020-09-08 NOTE — Assessment & Plan Note (Signed)
Patient with witnessed apneic episodes, unrestful sleep, daytime somnolence, and audible snoring.  Will place referral to sleep medicine for sleep study.

## 2020-09-08 NOTE — Patient Instructions (Signed)
It was very nice to see you today!  I will refill your inhaler today.  I will place a referral for you to see an allergist and also referral for you to have a sleep study done.  Please call Dr. Nevada Crane  [phone] to schedule appointment with dermatology.  Take care, Dr Jerline Pain  PLEASE NOTE:  If you had any lab tests please let us know if you have not heard back within a few days. You may see your results on mychart before we have a chance to review them but we will give you a call once they are reviewed by [place]. If we ordered any referrals today, please let us know if you have not heard from their office within the next week.   Please try these tips to maintain a healthy lifestyle:   Eat at least 3 REAL meals and 1-2 snacks per day.  Aim for no more than 5 hours between eating.  If you eat breakfast, please do so within one hour of getting up.    Each meal should contain half fruits/vegetables, one quarter protein, and one quarter carbs (no bigger than a computer mouse)   Cut down on sweet beverages. This includes juice, soda, and sweet tea.     Drink at least 1 glass of water with each meal and aim for at least 8 glasses per day   Exercise at least 150 minutes every week.

## 2020-10-01 ENCOUNTER — Telehealth: Payer: Self-pay

## 2020-10-01 DIAGNOSIS — Z3202 Encounter for pregnancy test, result negative: Secondary | ICD-10-CM | POA: Diagnosis not present

## 2020-10-01 DIAGNOSIS — R200 Anesthesia of skin: Secondary | ICD-10-CM | POA: Diagnosis not present

## 2020-10-01 DIAGNOSIS — R509 Fever, unspecified: Secondary | ICD-10-CM | POA: Diagnosis not present

## 2020-10-01 DIAGNOSIS — Z20822 Contact with and (suspected) exposure to covid-19: Secondary | ICD-10-CM | POA: Diagnosis not present

## 2020-10-01 NOTE — Telephone Encounter (Signed)
Nurse Assessment Nurse: Hardin Negus, RN, Danae Chen Date/Time Eilene Ghazi Time): 10/01/2020 11:17:46 AM Confirm and document reason for call. If symptomatic, describe symptoms. ---Caller states she has numbness on the left side of her body. States noticed in her arm yesterday. Today she is noticing it in her side of stomach and her left leg also. No facial drooping. Denies headache. Does the patient have any new or worsening symptoms? ---Yes Will a triage be completed? ---Yes Related visit to physician within the last 2 weeks? ---No Does the PT have any chronic conditions? (i.e. diabetes, asthma, this includes High risk factors for pregnancy, etc.) ---Yes List chronic conditions. ---asthma Is the patient pregnant or possibly pregnant? (Ask all females between the ages of 22-55) ---No Is this a behavioral health or substance abuse call? ---No Guidelines Guideline Title Affirmed Question Affirmed Notes Nurse Date/Time (Eastern Time) Neurologic Deficit [1] Weakness (i.e., paralysis, loss of muscle strength) of [name] 10/01/2020 11:19:39 AM PLEASE NOTE: All timestamps contained within this report are represented as [place] Standard Time. CONFIDENTIALTY NOTICE: This fax transmission is intended only for the addressee. It contains information that is legally privileged, confidential or otherwise protected from use or disclosure. If you are not the intended recipient, you are strictly prohibited from reviewing, disclosing, copying using or disseminating any of this information or taking any action in reliance on or regarding this information. If you have received this fax in error, please notify us immediately by telephone so that we can arrange for its return to [place]. Phone: [phone], Toll-Free: [phone], Fax: [phone] Page: 2 of 2 Call Id: [id number] Guidelines Guideline Title Affirmed Question Affirmed Notes Nurse Date/Time Eilene Ghazi Time) the face, arm / hand, or leg / foot  on one side of the body AND [2] sudden onset AND [3] present now (Exception: Bell's palsy suspected [i.e., weakness only on one side of the face, developing over hours to days, no other symptoms]) Disp. Time Eilene Ghazi Time) Disposition Final User 10/01/2020 11:16:43 AM Send to Urgent [name], MATHEW 10/01/2020 11:24:49 AM 911 Outcome Documentation Hardin Negus, RN, Danae Chen Reason: refuses 911, states will have coworker drive her to ED that is 5-10 minutes away 10/01/2020 11:24:13 AM Call EMS 911 Now Yes Hardin Negus, RN, Danae Chen Caller Disagree/Comply Disagree Caller Understands Yes PreDisposition InappropriateToAsk Care Advice Given Per Guideline CALL EMS 911 NOW: * Immediate medical attention is needed. You need to hang up and call 911 (or an ambulance). * Triager Discretion: I'll call you back in a few minutes to be sure you were able to reach them. CARE ADVICE given per Neurologic Deficit (Adult) guideline. Comments User: Shelby Cava, RN Date/Time Eilene Ghazi Time): 10/01/2020 11:21:40 AM *states yesterday it was constant x4 hours- took ibuprofen and it gradually resolved, today it came on suddenly Referrals GO TO [place]

## 2020-10-04 ENCOUNTER — Telehealth: Payer: Self-pay

## 2020-10-04 ENCOUNTER — Other Ambulatory Visit: Payer: Self-pay | Admitting: *Deleted

## 2020-10-04 NOTE — Telephone Encounter (Signed)
See note

## 2020-10-04 NOTE — Telephone Encounter (Signed)
Patient been referred to neuro If any problems with contact us

## 2020-10-04 NOTE — Telephone Encounter (Signed)
Agree with neuro referral. Please place referral if needed.  Algis Greenhouse. Jerline Pain, MD 10/04/2020 11:07 AM

## 2020-10-04 NOTE — Telephone Encounter (Signed)
Pt wanted to update Dr. Jerline Pain on her hospital visit from Friday 4/22. Pt states they ruled out stroke and are going to refer her to a neurologist. Please advise.

## 2020-10-06 ENCOUNTER — Telehealth: Payer: Self-pay

## 2020-10-06 ENCOUNTER — Other Ambulatory Visit: Payer: Self-pay

## 2020-10-06 ENCOUNTER — Ambulatory Visit: Payer: BC Managed Care – PPO | Admitting: Family Medicine

## 2020-10-06 ENCOUNTER — Encounter: Payer: Self-pay | Admitting: Family Medicine

## 2020-10-06 VITALS — BP 108/72 | HR 76 | Temp 98.6°F | Ht 60.0 in | Wt 226.5 lb

## 2020-10-06 DIAGNOSIS — F322 Major depressive disorder, single episode, severe without psychotic features: Secondary | ICD-10-CM | POA: Diagnosis not present

## 2020-10-06 DIAGNOSIS — R200 Anesthesia of skin: Secondary | ICD-10-CM | POA: Diagnosis not present

## 2020-10-06 DIAGNOSIS — R202 Paresthesia of skin: Secondary | ICD-10-CM | POA: Diagnosis not present

## 2020-10-06 DIAGNOSIS — F411 Generalized anxiety disorder: Secondary | ICD-10-CM | POA: Diagnosis not present

## 2020-10-06 DIAGNOSIS — F324 Major depressive disorder, single episode, in partial remission: Secondary | ICD-10-CM | POA: Insufficient documentation

## 2020-10-06 MED ORDER — ESCITALOPRAM OXALATE 10 MG PO TABS: ORAL_TABLET | ORAL | 2 refills | Status: DC

## 2020-10-06 NOTE — Patient Instructions (Signed)
Please consider counseling. Contact [phone] to schedule an appointment or inquire about cost/insurance coverage.  Aim to do some physical exertion for 150 minutes per week. This is typically divided into 5 days per week, 30 minutes per day. The activity should be enough to get your heart rate up. Anything is better than nothing if you have time constraints.  Coping skills Choose 5 that work for you:  Take a deep breath  Count to 20  Read a book  Do a puzzle  Meditate  Bake  Sing  Knit  Garden  Pray  Go outside  Call a friend  Listen to music  Take a walk  Color  Send a note  Take a bath  Watch a movie  Be alone in a quiet place  Pet an animal  Visit a friend  Journal  Exercise  Stretch    Let us know if you need anything.  

## 2020-10-06 NOTE — Telephone Encounter (Signed)
Patient is calling in wanting a call back so she is able to inform Dr.Parker of what all was said a the neurologist.

## 2020-10-06 NOTE — Progress Notes (Signed)
Chief Complaint  Patient presents with  . Depression    Subjective Shelby Jackson presents for f/u anxiety/depression.  Dx'd as a teen. Over the past year, she has been working full time and also in school. This has increased her stress and things have been worsen.  Pt is currently being treated with nothing.  No thoughts of harming self or others. No self-medication with alcohol, prescription drugs or illicit drugs. +Famhx of dep/anx in grandparents, aunts, mom, cousins; dad has PTSD, uncle has schizophrenia.  Pt is not following with a counselor/psychologist.  Past Medical History:  Diagnosis Date  . Abnormal Pap smear of cervix 2009  . Anxiety   . Asthma   . Bronchitis   . 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 Depression   . Elevated serum creatinine   . Fibroids   . H/O chlamydia infection 07/2012   Allergies as of 10/06/2020      Reactions   Pineapple    Albuterol    Anaphylaxis.    Bactrim [sulfamethoxazole-trimethoprim] Other (See Comments)   Body aches   Caffeine    Veins popped out of head   Sulfa Antibiotics Other (See Comments)   Body aches.   Latex Rash      Medication List       Accurate as of October 06, 2020  3:34 PM. If you have any questions, ask your nurse or doctor.        acetaminophen 500 MG tablet Commonly known as: TYLENOL Take 1,000 mg by mouth every 6 (six) hours as needed for moderate pain.   cetirizine 10 MG tablet Commonly known as: ZYRTEC Take 10 mg by mouth daily as needed for allergies.   escitalopram 10 MG tablet Commonly known as: Lexapro Take 1/2 tab daily for the first 2 weeks and then take 1 tab daily. Started by: Crosby Oyster Shareena Nusz, DO   ipratropium 0.06 % nasal spray Commonly known as: Atrovent Place 2 sprays into both nostrils 4 (four) times daily. What changed:   when to take this  reasons to take this   montelukast 10 MG tablet Commonly known as: SINGULAIR Take 1  tablet (10 mg total) by mouth at bedtime.   multivitamin with minerals Tabs tablet Take 1 tablet by mouth daily.   pantoprazole 40 MG tablet Commonly known as: PROTONIX Take 1 tablet (40 mg total) by mouth daily as needed.   polyethylene glycol 17 g packet Commonly known as: MiraLax Use 17 gm dose three times daily, with a stool softener, until bowels clear, maximum of 3 consecutive days.   Qvar 80 MCG/ACT inhaler Generic drug: beclomethasone Inhale 1 puff into the lungs 2 (two) times daily.   valACYclovir 500 MG tablet Commonly known as: Valtrex Take 1 tablet (500 mg total) by mouth daily. Increase to 1 tablet bid for 3 days for an outbreak.       Exam BP 108/72 (BP Location: Left Arm, Patient Position: Sitting, Cuff Size: Normal)   Pulse 76   Temp 98.6 F (37 C) (Oral)   Ht 5' (1.524 m)   Wt 226 lb 8 oz (102.7 kg)   SpO2 97%   BMI 44.24 kg/m  General:  well developed, well nourished, in no apparent distress Lungs:  No respiratory distress Psych: well oriented with normal range of affect and age-appropriate judgement/insight, alert and oriented x4.  Assessment and Plan  Depression, major, single episode, severe ([place]) - Plan: escitalopram (LEXAPRO) 10 MG tablet  GAD (generalized anxiety disorder) - Plan: escitalopram (LEXAPRO) 10 MG tablet  PHQ9 is 24, very difficult to manage things. [facility] info provided in avs.  Start Lexapro 5 mg daily for 2 weeks.  Then increase to 10 mg daily.  Anxiety coping techniques provided in her paperwork.  Counseled on exercise as adjunct management. F/u in 1 mo w reg PCP. The patient voiced understanding and agreement to the plan.  [place], DO 10/06/20 3:34 PM

## 2020-10-07 NOTE — Telephone Encounter (Signed)
I appreciate the update. Hopefully the MRI will give good news. They should send me a copy of all the results. Would like for her to let us know if she needs any further assistance .  Algis Greenhouse. Jerline Pain, MD 10/07/2020 12:57 PM

## 2020-10-07 NOTE — Telephone Encounter (Signed)
Patient update from neurologist appt. She stated they ordered a MRI for Brain and Spine to rule out Multiple Sclerosis

## 2020-10-08 ENCOUNTER — Encounter: Payer: Self-pay | Admitting: *Deleted

## 2020-10-18 ENCOUNTER — Other Ambulatory Visit: Payer: Self-pay

## 2020-10-18 ENCOUNTER — Ambulatory Visit: Payer: BC Managed Care – PPO | Admitting: Neurology

## 2020-10-18 ENCOUNTER — Encounter: Payer: Self-pay | Admitting: Neurology

## 2020-10-18 VITALS — BP 127/83 | HR 75 | Ht 60.0 in | Wt 226.0 lb

## 2020-10-18 DIAGNOSIS — R0683 Snoring: Secondary | ICD-10-CM

## 2020-10-18 DIAGNOSIS — G4719 Other hypersomnia: Secondary | ICD-10-CM

## 2020-10-18 DIAGNOSIS — R0681 Apnea, not elsewhere classified: Secondary | ICD-10-CM

## 2020-10-18 DIAGNOSIS — Z820 Family history of epilepsy and other diseases of the nervous system: Secondary | ICD-10-CM | POA: Diagnosis not present

## 2020-10-18 DIAGNOSIS — R351 Nocturia: Secondary | ICD-10-CM

## 2020-10-18 NOTE — Patient Instructions (Signed)

## 2020-10-18 NOTE — Progress Notes (Signed)
Subjective:    Patient ID: Shelby Jackson is a 30 y.o. female.  HPI     Star Age, MD, PhD [facility] [address], [address] [place], [place] [address]  Dear Dr. Jerline Pain,  I saw your patient, Shelby Jackson, upon your kind request, in my sleep clinic today for initial consultation of her sleep disorder, in particular, concern for underlying obstructive sleep apnea.  The patient is unaccompanied today.  As you know, [name] is a 30 year old right-handed woman with an underlying medical history of asthma, allergies, anxiety, depression, and severe obesity with a BMI of over 40, who reports snoring and excessive daytime somnolence as well as nonrestorative sleep. I reviewed your office note from 09/08/2020.  Her Epworth sleepiness score is 11 out of 24, fatigue severity score is 6 out of 63.  Her boyfriend has noted apneic pauses while she is asleep and she has occasionally woken up with a sense of gasping for air.  She denies any morning headaches or nocturnal headaches but has had recurrent headaches, attributed by her to allergies and stress.  She currently is a Ship broker at [employer], studying rehab counseling.  She also works as a Education officer, museum.  Her dad has sleep apnea and has a CPAP machine.  She is single and lives with a roommate.  They have no pets in the house, she does have a TV in her bedroom and it tends to stay on at night as she does not like to sleep in the dark.  She turns the volume down.  Her weight has been fluctuating, typically stays below 230 pounds.  She is a non-smoker and does not drink alcohol and drinks caffeine occasionally in the form of soda.  She has nocturia about once per average night.  She has had sleep issues for the past 2 years.  Bedtime can vary, may be in bed between 10 PM and 1 AM, rise time typically around 8 for work.     Her Past Medical History Is Significant For: Past Medical History:   Diagnosis Date  . Abnormal Pap smear of cervix 2009  . Anxiety   . Asthma   . Bronchitis   . 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 Depression   . Elevated serum creatinine   . Fibroids   . H/O chlamydia infection 07/2012    Her Past Surgical History Is Significant For: History reviewed. No pertinent surgical history.  Her Family History Is Significant For: Family History  Problem Relation Age of Onset  . Anxiety disorder Father   . Diabetes Father   . Heart disease Father   . Arthritis Father   . Hypothyroidism Paternal Aunt   . Heart disease Maternal Grandfather   . Hypothyroidism Sister     Her Social History Is Significant For: Social History   Socioeconomic History  . Marital status: Single    Spouse name: Not on file  . Number of children: Not on file  . Years of education: Not on file  . Highest education level: Not on file  Occupational History  . Not on file  Tobacco Use  . Smoking status: Never Smoker  . Smokeless tobacco: Never Used  Vaping Use  . Vaping Use: Never used  Substance and Sexual Activity  . Alcohol use: No  . Drug use: No  . Sexual activity: Yes    Partners: Male    Birth control/protection: Condom  Other  Topics Concern  . Not on file  Social History Narrative  . Not on file   Social Determinants of Health   Financial Resource Strain: Not on file  Food Insecurity: Not on file  Transportation Needs: Not on file  Physical Activity: Not on file  Stress: Not on file  Social Connections: Not on file    Her Allergies Are:  Allergies  Allergen Reactions  . Pineapple   . Albuterol     Anaphylaxis.   . Bactrim [Sulfamethoxazole-Trimethoprim] Other (See Comments)    Body aches  . Caffeine     Veins popped out of head  . Sulfa Antibiotics Other (See Comments)    Body aches.  . Latex Rash  :   Her Current Medications Are:  Outpatient Encounter Medications as of 10/18/2020  Medication Sig  .  acetaminophen (TYLENOL) 500 MG tablet Take 1,000 mg by mouth every 6 (six) hours as needed for moderate pain.  . beclomethasone (QVAR) 80 MCG/ACT inhaler Inhale 1 puff into the lungs 2 (two) times daily.  . cetirizine (ZYRTEC) 10 MG tablet Take 10 mg by mouth daily as needed for allergies.  Marland Kitchen escitalopram (LEXAPRO) 10 MG tablet Take 1/2 tab daily for the first 2 weeks and then take 1 tab daily.  Marland Kitchen ipratropium (ATROVENT) 0.06 % nasal spray Place 2 sprays into both nostrils 4 (four) times daily. (Patient taking differently: Place 2 sprays into both nostrils daily as needed for rhinitis.)  . montelukast (SINGULAIR) 10 MG tablet Take 1 tablet (10 mg total) by mouth at bedtime.  . Multiple Vitamin (MULTIVITAMIN WITH MINERALS) TABS tablet Take 1 tablet by mouth daily.  . pantoprazole (PROTONIX) 40 MG tablet Take 1 tablet (40 mg total) by mouth daily as needed.  . polyethylene glycol (MIRALAX) 17 g packet Use 17 gm dose three times daily, with a stool softener, until bowels clear, maximum of 3 consecutive days.  . valACYclovir (VALTREX) 500 MG tablet Take 1 tablet (500 mg total) by mouth daily. Increase to 1 tablet bid for 3 days for an outbreak.   No facility-administered encounter medications on file as of 10/18/2020.  :   Review of Systems:  Out of a complete 14 point review of systems, all are reviewed and negative with the exception of these symptoms as listed below: Review of Systems  Neurological:       Here for sleep consult. No prior sleep study. Reports snoring is present and along with frequent headaches. Epworth Sleepiness Scale 0= would never doze 1= slight chance of dozing 2= moderate chance of dozing 3= high chance of dozing  Sitting and reading:3 Watching TV:1 Sitting inactive in a public place (ex. Theater or meeting):1 As a passenger in a car for an hour without a break:3 Lying down to rest in the afternoon:0 Sitting and talking to someone:0 Sitting quietly after lunch (no  alcohol):3 In a car, while stopped in traffic:0 Total:11     Objective:  Neurological Exam  Physical Exam Physical Examination:   Vitals:   10/18/20 1455  BP: 127/83  Pulse: 75    General Examination: The patient is a very pleasant 30 y.o. female in no acute distress. She appears well-developed and well-nourished and well groomed.   HEENT: Normocephalic, atraumatic, pupils are equal, round and reactive to light, extraocular tracking is good without limitation to gaze excursion or nystagmus noted.  Corrective eyeglasses in place.  Hearing is grossly intact. Face is symmetric with normal facial animation. Speech is clear with  no dysarthria noted. There is no hypophonia. There is no lip, neck/head, jaw or voice tremor. Neck is supple with full range of passive and active motion. There are no carotid bruits on auscultation. Oropharynx exam reveals: mild mouth dryness, good dental hygiene and mild to moderate airway crowding secondary to tonsillar size of about 2+, Mallampati class III, small airway entry noted.  Neck circumference of 15 three-quarter inches.  She has a mild overbite.  Nasal inspection reveals no significant inferior turbinate hypertrophy or septal deviation.   Chest: Clear to auscultation without wheezing, rhonchi or crackles noted.  Heart: S1+S2+0, regular and normal without murmurs, rubs or gallops noted.   Abdomen: Soft, non-tender and non-distended with normal bowel sounds appreciated on auscultation.  Extremities: There is no pitting edema in the distal lower extremities bilaterally.   Skin: Warm and dry without trophic changes noted.   Musculoskeletal: exam reveals no obvious joint deformities, tenderness or joint swelling or erythema.   Neurologically:  Mental status: The patient is awake, alert and oriented in all 4 spheres. Her immediate and remote memory, attention, language skills and fund of knowledge are appropriate. There is no evidence of aphasia,  agnosia, apraxia or anomia. Speech is clear with normal prosody and enunciation. Thought process is linear. Mood is normal and affect is normal.  Cranial nerves II - XII are as described above under HEENT exam.  Motor exam: Normal bulk, strength and tone is noted. There is no tremor, no drift, no rebound.  Romberg is negative. Fine motor skills and coordination: grossly intact.  Cerebellar testing: No dysmetria or intention tremor.  Finger-to-nose and heel-to-shin normal.  There is no truncal or gait ataxia.  Sensory exam: intact to light touch in the upper and lower extremities.  Gait, station and balance: She stands easily. No veering to one side is noted. No leaning to one side is noted. Posture is age-appropriate and stance is narrow based. Gait shows normal stride length and normal pace. No problems turning are noted. Tandem walk is unremarkable.                Assessment and Plan:  In summary, SHAELIN FREGEAU is a very pleasant 30 y.o.-year old female with an underlying medical history of asthma, allergies, anxiety, depression, and severe obesity with a BMI of over 40, whose history and physical exam are concerning for obstructive sleep apnea (OSA). I had a long chat with the patient about my findings and the diagnosis of OSA, its prognosis and treatment options. We talked about medical treatments, surgical interventions and non-pharmacological approaches. I explained in particular the risks and ramifications of untreated moderate to severe OSA, especially with respect to developing cardiovascular disease down the Road, including congestive heart failure, difficult to treat hypertension, cardiac arrhythmias, or stroke. Even type 2 diabetes has, in part, been linked to untreated OSA. Symptoms of untreated OSA include daytime sleepiness, memory problems, mood irritability and mood disorder such as depression and anxiety, lack of energy, as well as recurrent headaches, especially morning headaches.  We talked about trying to maintain a healthy lifestyle in general, as well as the importance of weight control. We also talked about the importance of good sleep hygiene. I recommended the following at this time: sleep study.   I explained the sleep test procedure to the patient and also outlined possible surgical and non-surgical treatment options of OSA, including the use of a custom-made dental device (which would require a referral to a specialist dentist or oral  surgeon), upper airway surgical options, such as traditional UPPP or a novel less invasive surgical option in the form of Inspire hypoglossal nerve stimulation (which would involve a referral to an ENT surgeon). I also explained the CPAP treatment option to the patient, who indicated that she would be willing to try CPAP if the need arises. I explained the importance of being compliant with PAP treatment, not only for insurance purposes but primarily to improve Her symptoms, and for the patient's long term health benefit, including to reduce Her cardiovascular risks. I answered all her questions today and the patient was in agreement. I plan to see her back after the sleep study is completed and encouraged her to call with any interim questions, concerns, problems or updates.   Thank you very much for allowing me to participate in the care of this nice patient. If I can be of any further assistance to you please do not hesitate to call me at [phone].  Sincerely,   Star Age, MD, PhD

## 2020-10-22 DIAGNOSIS — R202 Paresthesia of skin: Secondary | ICD-10-CM | POA: Diagnosis not present

## 2020-10-22 DIAGNOSIS — R200 Anesthesia of skin: Secondary | ICD-10-CM | POA: Diagnosis not present

## 2020-11-01 ENCOUNTER — Other Ambulatory Visit: Payer: Self-pay

## 2020-11-01 ENCOUNTER — Encounter: Payer: Self-pay | Admitting: Allergy

## 2020-11-01 ENCOUNTER — Ambulatory Visit: Payer: BC Managed Care – PPO | Admitting: Allergy

## 2020-11-01 VITALS — BP 124/82 | HR 65 | Temp 98.0°F | Resp 16 | Ht 61.0 in | Wt 225.6 lb

## 2020-11-01 DIAGNOSIS — J3089 Other allergic rhinitis: Secondary | ICD-10-CM | POA: Diagnosis not present

## 2020-11-01 DIAGNOSIS — H1013 Acute atopic conjunctivitis, bilateral: Secondary | ICD-10-CM

## 2020-11-01 DIAGNOSIS — T781XXD Other adverse food reactions, not elsewhere classified, subsequent encounter: Secondary | ICD-10-CM | POA: Insufficient documentation

## 2020-11-01 DIAGNOSIS — J45909 Unspecified asthma, uncomplicated: Secondary | ICD-10-CM | POA: Diagnosis not present

## 2020-11-01 DIAGNOSIS — T50995D Adverse effect of other drugs, medicaments and biological substances, subsequent encounter: Secondary | ICD-10-CM | POA: Insufficient documentation

## 2020-11-01 MED ORDER — AZELASTINE HCL 0.1 % NA SOLN: 1.0000 | Freq: Two times a day (BID) | NASAL | 5 refills | Status: AC | PRN

## 2020-11-01 NOTE — Assessment & Plan Note (Signed)
Perennial rhinoconjunctivitis symptoms for 4 years.  Tried Claritin, Zyrtec, Singulair and Flonase with some benefit.  No prior allergy/ENT evaluation.  Today's skin testing showed: Positive to mold, cat, cockroach, dust mites.   Start environmental control measures as below.  Use over the counter antihistamines such as Zyrtec (cetirizine), Claritin (loratadine), Allegra (fexofenadine), or Xyzal (levocetirizine) daily as needed. May take twice a day during allergy flares. May switch antihistamines every few months.  Continue Singulair (montelukast) 10mg  daily at night.  Use Flonase (fluticasone) nasal spray 1 spray per nostril twice a day as needed for nasal congestion.   Use azelastine nasal spray 1-2 sprays per nostril twice a day as needed for runny nose/drainage.  Nasal saline spray (i.e., Simply Saline) or nasal saline lavage (i.e., NeilMed) is recommended as needed and prior to medicated nasal sprays.  Consider allergy injections for long term control if above medications do not help the symptoms - handout given.

## 2020-11-01 NOTE — Patient Instructions (Addendum)
Today's skin testing showed: Positive to mold, cat, cockroach, dust mites.   Environmental allergies  Start environmental control measures as below.  Use over the counter antihistamines such as Zyrtec (cetirizine), Claritin (loratadine), Allegra (fexofenadine), or Xyzal (levocetirizine) daily as needed. May take twice a day during allergy flares. May switch antihistamines every few months.  Continue Singulair (montelukast) 10mg  daily at night.  Use Flonase (fluticasone) nasal spray 1 spray per nostril twice a day as needed for nasal congestion.   Use azelastine nasal spray 1-2 sprays per nostril twice a day as needed for runny nose/drainage.  Nasal saline spray (i.e., Simply Saline) or nasal saline lavage (i.e., NeilMed) is recommended as needed and prior to medicated nasal sprays.  Consider allergy injections for long term control if above medications do not help the symptoms - handout given.   Food allergies:  Negative to pineapple.  Most likely irritation from the enzyme.  Continue to avoid if having issues.   Drug allergy:  If interested we can schedule drug challenge to levoalbuterol. You must be off antihistamines for 3-5 days before. Must be in good health and not ill. Plan on being in the office for 2-3 hours. You must call to schedule an appointment and specify it's for a drug challenge.   Continue to avoid drugs that bother you.  Asthma:  Monitor symptoms. Daily controller medication(s): Qvar 23mcg 1 puff twice a day and rinse mouth after each use.  Asthma control goals:  Full participation in all desired activities (may need albuterol before activity) Albuterol use two times or less a week on average (not counting use with activity) Cough interfering with sleep two times or less a month Oral steroids no more than once a year No hospitalizations  Follow up for drug challenge.   Mold Control . Mold and fungi can grow on a variety of surfaces provided certain  temperature and moisture conditions exist.  . Outdoor molds grow on plants, decaying vegetation and soil. The major outdoor mold, Alternaria and Cladosporium, are found in very high numbers during hot and dry conditions. Generally, a late summer - fall peak is seen for common outdoor fungal spores. Rain will temporarily lower outdoor mold spore count, but counts rise rapidly when the rainy period ends. . The most important indoor molds are Aspergillus and Penicillium. Dark, humid and poorly ventilated basements are ideal sites for mold growth. The next most common sites of mold growth are the bathroom and the kitchen. Outdoor (Seasonal) Mold Control . Use air conditioning and keep windows closed. . Avoid exposure to decaying vegetation. Marland Kitchen Avoid leaf raking. . Avoid grain handling. . Consider wearing a face mask if working in moldy areas.  Indoor (Perennial) Mold Control  . Maintain humidity below 50%. . Get rid of mold growth on hard surfaces with water, detergent and, if necessary, 5% bleach (do not mix with other cleaners). Then dry the area completely. If mold covers an area more than 10 square feet, consider hiring an indoor environmental professional. . For clothing, washing with soap and water is best. If moldy items cannot be cleaned and dried, throw them away. . Remove sources e.g. contaminated carpets. . Repair and seal leaking roofs or pipes. Using dehumidifiers in damp basements may be helpful, but empty the water and clean units regularly to prevent mildew from forming. All rooms, especially basements, bathrooms and kitchens, require ventilation and cleaning to deter mold and mildew growth. Avoid carpeting on concrete or damp floors, and storing items in  damp areas. Control of House Dust Mite Allergen . Dust mite allergens are a common trigger of allergy and asthma symptoms. While they can be found throughout the house, these microscopic creatures thrive in warm, humid environments such  as bedding, upholstered furniture and carpeting. . Because so much time is spent in the bedroom, it is essential to reduce mite levels there.  . Encase pillows, mattresses, and box springs in special allergen-proof fabric covers or airtight, zippered plastic covers.  . Bedding should be washed weekly in hot water (130 F) and dried in a hot dryer. Allergen-proof covers are available for comforters and pillows that can't be regularly washed.  Wendee Copp the allergy-proof covers every few months. Minimize clutter in the bedroom. Keep pets out of the bedroom.  Marland Kitchen Keep humidity less than 50% by using a dehumidifier or air conditioning. You can buy a humidity measuring device called a hygrometer to monitor this.  . If possible, replace carpets with hardwood, linoleum, or washable area rugs. If that's not possible, vacuum frequently with a vacuum that has a HEPA filter. . Remove all upholstered furniture and non-washable window drapes from the bedroom. . Remove all non-washable stuffed toys from the bedroom.  Wash stuffed toys weekly. Pet Allergen Avoidance: . Contrary to popular opinion, there are no "hypoallergenic" breeds of dogs or cats. That is because people are not allergic to an animal's hair, but to an allergen found in the animal's saliva, dander (dead skin flakes) or urine. Pet allergy symptoms typically occur within minutes. For some people, symptoms can build up and become most severe 8 to 12 hours after contact with the animal. People with severe allergies can experience reactions in public places if dander has been transported on the pet owners' clothing. Marland Kitchen Keeping an animal outdoors is only a partial solution, since homes with pets in the yard still have higher concentrations of animal allergens. . Before getting a pet, ask your allergist to determine if you are allergic to animals. If your pet is already considered part of your family, try to minimize contact and keep the pet out of the bedroom and  other rooms where you spend a great deal of time. . As with dust mites, vacuum carpets often or replace carpet with a hardwood floor, tile or linoleum. . High-efficiency particulate air (HEPA) cleaners can reduce allergen levels over time. . While dander and saliva are the source of cat and dog allergens, urine is the source of allergens from rabbits, hamsters, mice and [place] pigs; so ask a non-allergic family member to clean the animal's cage. . If you have a pet allergy, talk to your allergist about the potential for allergy immunotherapy (allergy shots). This strategy can often provide long-term relief. Cockroach Allergen Avoidance Cockroaches are often found in the homes of densely populated urban areas, schools or commercial buildings, but these creatures can lurk almost anywhere. This does not mean that you have a dirty house or living area. . Block all areas where roaches can enter the home. This includes crevices, wall cracks and windows.  . Cockroaches need water to survive, so fix and seal all leaky faucets and pipes. Have an exterminator go through the house when your family and pets are gone to eliminate any remaining roaches. Marland Kitchen Keep food in lidded containers and put pet food dishes away after your pets are done eating. Vacuum and sweep the floor after meals, and take out garbage and recyclables. Use lidded garbage containers in the kitchen. Wash dishes  immediately after use and clean under stoves, refrigerators or toasters where crumbs can accumulate. Wipe off the stove and other kitchen surfaces and cupboards regularly.

## 2020-11-01 NOTE — Assessment & Plan Note (Signed)
   See assessment and plan as above. . Declines eyedrops. 

## 2020-11-01 NOTE — Assessment & Plan Note (Signed)
Diagnosed with asthma 15 years ago and using Qvar 80 mcg 1 puff twice a day for 2 years with unknown benefit.  Apparently she had some type of allergic reaction after using albuterol as a teen so she has no rescue inhaler on hand.   Today's spirometry showed some restriction.  Monitor symptoms. Daily controller medication(s): Qvar 1mcg 1 puff twice a day and rinse mouth after each use.  Strongly encouraged patient to schedule drug challenge for a rescue inhaler as it is rare to be allergic to them.

## 2020-11-01 NOTE — Assessment & Plan Note (Signed)
   Recommend drug challenge to rescue inhaler - xopenex.   Continue to avoid drugs that bother you.

## 2020-11-01 NOTE — Progress Notes (Signed)
New Patient Note  RE: Shelby Jackson MRN: [id number] DOB: 1991/05/27 Date of Office Visit: 11/01/2020  Consult requested by: Shelby Barrack, MD Primary care provider: Vivi Barrack, MD  Chief Complaint: Allergic Rhinitis  (Nasal congestion not able to smell, puffy eyes - red and itchy, constant ear infections, fevers, and headaches ( tried singular and it did not help stopped taking)) and Asthma (ACT - some cough/wheezing at night, some shortness of breath and need for rescue inhaler )  History of Present Illness: I had the pleasure of seeing Shelby Jackson for initial evaluation at the [facility] on 11/01/2020. She is a 30 y.o. female, who is referred here by Shelby Barrack, MD for the evaluation of allergic rhinitis and asthma.  Rhinitis:  She reports symptoms of nasal congestion, headaches, sneezing, rhinorrhea, PND, itchy/watery eyes, itchy nose. Symptoms have been going on for 4 years. The symptoms are present all year around. Other triggers include exposure to after raining. Anosmia: diminished sense of smell. Headache: yes. She has used Claritin, zyrtec, Singulair, Flonase with some improvement in symptoms. Sinus infections: 1 course of antibiotics last year. Previous work up includes: none. Previous ENT evaluation: no. Previous sinus imaging: no. History of nasal polyps: no. Last eye exam: not recently. History of reflux: yes and takes Protonix daily prn.  Asthma:  She reports symptoms of chest tightness, shortness of breath, coughing, wheezing for 15 years. Current medications include Qvar 23mcg 1 puff BID x 2 yrs with unknown. She reports not using aerochamber with inhalers. She tried the following inhalers: albuterol which caused difficulty breathing, veins of her head was popping out and not sure how long it took for symptoms to resolve - patient was taken to the ER.   Main triggers are exercise, strong scents, colognes, pollen. In the last  month, frequency of symptoms: depends. Frequency of SABA use: 0x/week. Interference with physical activity: yes. In the last 12 months, emergency room visits/urgent care visits/doctor office visits or hospitalizations due to respiratory issues: no. In the last 12 months, oral steroids courses: no. Lifetime history of hospitalization for respiratory issues: no. Prior intubations: no. History of pneumonia: no. She was not evaluated by allergist/pulmonologist in the past. Smoking exposure: denies. Up to date with flu vaccine: no. Up to date with COVID-19 vaccine: yes. Prior Covid-19 infection: yes x 2.   09/08/2020 PCP visit: "Seasonal allergies Not controlled on Atrovent, Singulair, and Zyrtec.  Will place referral to allergy for further management.  Mild persistent asthma Worsened recently most likely due to allergies and pollen. Will place referral to allergy as above." "" Assessment and Plan: [name] is a 30 y.o. female with: Other allergic rhinitis Perennial rhinoconjunctivitis symptoms for 4 years.  Tried Claritin, Zyrtec, Singulair and Flonase with some benefit.  No prior allergy/ENT evaluation.  Today's skin testing showed: Positive to mold, cat, cockroach, dust mites.   Start environmental control measures as below.  Use over the counter antihistamines such as Zyrtec (cetirizine), Claritin (loratadine), Allegra (fexofenadine), or Xyzal (levocetirizine) daily as needed. May take twice a day during allergy flares. May switch antihistamines every few months.  Continue Singulair (montelukast) 10mg  daily at night.  Use Flonase (fluticasone) nasal spray 1 spray per nostril twice a day as needed for nasal congestion.   Use azelastine nasal spray 1-2 sprays per nostril twice a day as needed for runny nose/drainage.  Nasal saline spray (i.e., Simply Saline) or nasal saline lavage (i.e., NeilMed) is recommended as  needed and prior to medicated nasal sprays.  Consider allergy injections for  long term control if above medications do not help the symptoms - handout given.  Allergic conjunctivitis of both eyes  See assessment and plan as above.  Declines eyedrops.  Asthma Diagnosed with asthma 15 years ago and using Qvar 80 mcg 1 puff twice a day for 2 years with unknown benefit.  Apparently she had some type of allergic reaction after using albuterol as a teen so she has no rescue inhaler on hand.   Today's spirometry showed some restriction.  Monitor symptoms. Daily controller medication(s): Qvar 9mcg 1 puff twice a day and rinse mouth after each use.  Strongly encouraged patient to schedule drug challenge for a rescue inhaler as it is rare to be allergic to them.   Adverse effect of other drugs, medicaments and biological substances, subsequent encounter  Recommend drug challenge to rescue inhaler - xopenex.   Continue to avoid drugs that bother you.  Other adverse food reactions, not elsewhere classified, subsequent encounter Perioral discomfort after eating pineapples.  Today's skin testing was negative.  Most likely irritation from the enzyme bromelain.  Continue to avoid if having issues.   Return for Drug challenge.  Meds ordered this encounter  Medications  . azelastine (ASTELIN) 0.1 % nasal spray    Sig: Place 1-2 sprays into both nostrils 2 (two) times daily as needed (itchy/watery eyes). Use in each nostril as directed    Dispense:  30 mL    Refill:  5   Lab Orders  No laboratory test(s) ordered today    Other allergy screening: Food allergy: yes  Pineapples - causes perioral pruritus. Medication allergy: yes Hymenoptera allergy: no Urticaria: no Eczema: yes and uses some type rx cream with good benefit.  History of recurrent infections suggestive of immunodeficency: no  Diagnostics: Spirometry:  Tracings reviewed. Her effort: Good reproducible efforts. FVC: 2.02L FEV1: 1.95L, 79% predicted FEV1/FVC ratio: 97% Interpretation:  Spirometry consistent with possible restrictive disease.  Please see scanned spirometry results for details.  Skin Testing: Environmental allergy panel and select foods. Positive to mold, cat, cockroach, dust mites.  Results discussed with patient/family.  Airborne Adult Perc - 11/01/20 1439    Time Antigen Placed 1439    Allergen Manufacturer Lavella Hammock    Location Back    Number of Test 59    1. Control-Buffer 50% Glycerol Negative    2. Control-Histamine 1 mg/ml 2+    3. Albumin saline Negative    4. [place] Negative    5. [place] Negative    6. Johnson Negative    7. [place] Blue Negative    8. Meadow Fescue Negative    9. Perennial Rye Negative    10. Sweet Vernal Negative    11. Timothy Negative    12. Cocklebur Negative    13. Burweed Marshelder Negative    14. Ragweed, short Negative    15. Ragweed, Giant Negative    16. Plantain,  English Negative    17. Lamb's Quarters Negative    18. Sheep Sorrell Negative    19. Rough Pigweed Negative    20. Marsh Elder, Rough Negative    21. Mugwort, Common Negative    22. Ash mix Negative    23. Birch mix Negative    24. Beech American Negative    25. Box, Elder Negative    26. Cedar, red Negative    27. Cottonwood, [place] Negative    28. Elm mix Negative  29. Hickory Negative    30. Maple mix Negative    31. Oak, [place] mix Negative    32. Pecan Pollen Negative    33. Pine mix Negative    34. Sycamore Eastern Negative    35. [place], Black Pollen Negative    36. Alternaria alternata Negative    37. Cladosporium Herbarum Negative    38. Aspergillus mix Negative    39. Penicillium mix Negative    40. Bipolaris sorokiniana (Helminthosporium) Negative    41. Drechslera spicifera (Curvularia) Negative    42. Mucor plumbeus Negative    43. Fusarium moniliforme Negative    44. Aureobasidium pullulans (pullulara) Negative    45. Rhizopus oryzae Negative    46. Botrytis cinera Negative    47. Epicoccum nigrum Negative     48. Phoma betae Negative    49. Candida Albicans Negative    50. Trichophyton mentagrophytes Negative    51. Mite, D Farinae  5,000 AU/ml Negative    52. Mite, D Pteronyssinus  5,000 AU/ml Negative    53. Cat Hair 10,000 BAU/ml Negative    54.  Dog Epithelia Negative    55. Mixed Feathers Negative    56. Horse Epithelia Negative    57. Cockroach, German Negative    58. Mouse Negative    59. Tobacco Leaf Negative                 Intradermal - 11/01/20 1555    Time Antigen Placed 1555    Allergen Manufacturer Lavella Hammock    Location Arm    Number of Test 15    Intradermal Select    Control Negative    [place] Negative    Johnson Negative    7 Grass Negative    Ragweed mix Negative    Weed mix Negative    Tree mix Negative    Mold 1 Negative    Mold 2 Negative    Mold 3 2+    Mold 4 Negative    Cat 2+    Dog Negative    Cockroach 2+    Mite mix 2+          Food Adult Perc - 11/01/20 1400    Time Antigen Placed 1439    Allergen Manufacturer Lavella Hammock    Location Back    Number of allergen test 2    Control-Histamine 1 mg/ml 2+    63. Pineapple Negative           Past Medical History: Patient Active Problem List   Diagnosis Date Noted  . Other allergic rhinitis 11/01/2020  . Allergic conjunctivitis of both eyes 11/01/2020  . Asthma 11/01/2020  . Other adverse food reactions, not elsewhere classified, subsequent encounter 11/01/2020  . Adverse effect of other drugs, medicaments and biological substances, subsequent encounter 11/01/2020  . Depression, major, single episode, severe ([place]) 10/06/2020  . GAD (generalized anxiety disorder) 10/06/2020  . Sleep-disordered breathing 09/08/2020  . Seasonal allergies 03/02/2020  . Gastroesophageal reflux disease without esophagitis 09/26/2018  . Leg mass, bilateral 04/19/2018  . Mild persistent asthma 04/19/2018  . Eczema 04/19/2018  . Elevated serum creatinine 04/19/2018   Past Medical History:  Diagnosis Date  .  Abnormal Pap smear of cervix 2009  . Anxiety   . Asthma   . Bronchitis   . 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 Depression   . Eczema   . Elevated serum creatinine   . Fibroids   . H/O  chlamydia infection 07/2012  . Recurrent upper respiratory infection (URI)    Past Surgical History: History reviewed. No pertinent surgical history. Medication List:  Current Outpatient Medications  Medication Sig Dispense Refill  . azelastine (ASTELIN) 0.1 % nasal spray Place 1-2 sprays into both nostrils 2 (two) times daily as needed (itchy/watery eyes). Use in each nostril as directed 30 mL 5  . beclomethasone (QVAR) 80 MCG/ACT inhaler Inhale 1 puff into the lungs 2 (two) times daily. 1 each 12  . cetirizine (ZYRTEC) 10 MG tablet Take 10 mg by mouth daily as needed for allergies.    Marland Kitchen escitalopram (LEXAPRO) 10 MG tablet Take 1/2 tab daily for the first 2 weeks and then take 1 tab daily. 30 tablet 2  . pantoprazole (PROTONIX) 40 MG tablet Take 1 tablet (40 mg total) by mouth daily as needed. 90 tablet 0  . polyethylene glycol (MIRALAX) 17 g packet Use 17 gm dose three times daily, with a stool softener, until bowels clear, maximum of 3 consecutive days. 9 each 0  . valACYclovir (VALTREX) 500 MG tablet Take 1 tablet (500 mg total) by mouth daily. Increase to 1 tablet bid for 3 days for an outbreak. 110 tablet 3  . acetaminophen (TYLENOL) 500 MG tablet Take 1,000 mg by mouth every 6 (six) hours as needed for moderate pain.    . Multiple Vitamin (MULTIVITAMIN WITH MINERALS) TABS tablet Take 1 tablet by mouth daily. (Patient not taking: Reported on 11/01/2020)     No current facility-administered medications for this visit.   Allergies: Allergies  Allergen Reactions  . Pineapple   . Albuterol     Anaphylaxis.   . Bactrim [Sulfamethoxazole-Trimethoprim] Other (See Comments)    Body aches  . Caffeine     Veins popped out of head  . Sulfa Antibiotics Other (See Comments)     Body aches.  . Latex Rash   Social History: Social History   Socioeconomic History  . Marital status: Single    Spouse name: Not on file  . Number of children: Not on file  . Years of education: Not on file  . Highest education level: Not on file  Occupational History  . Not on file  Tobacco Use  . Smoking status: Never Smoker  . Smokeless tobacco: Never Used  Vaping Use  . Vaping Use: Never used  Substance and Sexual Activity  . Alcohol use: No  . Drug use: No  . Sexual activity: Yes    Partners: Male    Birth control/protection: Condom  Other Topics Concern  . Not on file  Social History Narrative  . Not on file   Social Determinants of Health   Financial Resource Strain: Not on file  Food Insecurity: Not on file  Transportation Needs: Not on file  Physical Activity: Not on file  Stress: Not on file  Social Connections: Not on file   Lives in an apartment which is 31 year old. 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 Anxiety disorder Father   . Diabetes Father   . Heart disease Father   . Arthritis Father   . Hypothyroidism Paternal Aunt   . Heart disease Maternal Grandfather   . Hypothyroidism Sister    Problem  Relation Allergic rhino conjunctivitis     Brother   Review of Systems  Constitutional: Negative for appetite change, chills, fever and unexpected weight change.  HENT: Positive for congestion, rhinorrhea and sneezing.   Eyes: Positive for itching.  Respiratory: Negative for cough, chest tightness, shortness of breath and wheezing.   Cardiovascular: Negative for chest pain.  Gastrointestinal: Negative for abdominal pain.  Genitourinary: Negative for difficulty urinating.   Skin: Negative for rash.  Allergic/Immunologic: Positive for environmental allergies.  Neurological: Positive for headaches.   Objective: BP 124/82   Pulse 65   Temp 98 F (36.7 C)   Resp 16   Ht 5\' 1"  (1.549 m)   Wt 225 lb 9.6 oz (102.3 kg)   SpO2 97%   BMI 42.63 kg/m  Body mass index is 42.63 kg/m. Physical Exam Vitals and nursing note reviewed.  Constitutional:      Appearance: Normal appearance. She is well-developed.  HENT:     Head: Normocephalic and atraumatic.     Right Ear: External ear normal.     Left Ear: External ear normal.     Nose: Nose normal.     Mouth/Throat:     Mouth: Mucous membranes are moist.     Pharynx: Oropharynx is clear.  Eyes:     Conjunctiva/sclera: Conjunctivae normal.  Cardiovascular:     Rate and Rhythm: Normal rate and regular rhythm.     Heart sounds: Normal heart sounds. No murmur heard. No friction rub. No gallop.   Pulmonary:     Effort: Pulmonary effort is normal.     Breath sounds: Normal breath sounds. No wheezing, rhonchi or rales.  Abdominal:     Palpations: Abdomen is soft.  Musculoskeletal:     Cervical back: Neck supple.  Skin:    General: Skin is warm.     Findings: No rash.  Neurological:     Mental Status: She is alert and oriented to person, place, and time.  Psychiatric:        Behavior: Behavior normal.    The plan was reviewed with the patient/family, and all questions/concerned were addressed.  It was my pleasure to see Rosario today and participate in her care. Please feel free to contact me with any questions or concerns.  Sincerely,  Rexene Alberts, DO Allergy & Immunology  [facility] office: [place] office: [phone]

## 2020-11-01 NOTE — Assessment & Plan Note (Signed)
Perioral discomfort after eating pineapples.  Today's skin testing was negative.  Most likely irritation from the enzyme bromelain.  Continue to avoid if having issues.

## 2020-11-09 ENCOUNTER — Ambulatory Visit (INDEPENDENT_AMBULATORY_CARE_PROVIDER_SITE_OTHER): Payer: BC Managed Care – PPO | Admitting: Neurology

## 2020-11-09 ENCOUNTER — Other Ambulatory Visit: Payer: Self-pay

## 2020-11-09 DIAGNOSIS — R0681 Apnea, not elsewhere classified: Secondary | ICD-10-CM

## 2020-11-09 DIAGNOSIS — Z820 Family history of epilepsy and other diseases of the nervous system: Secondary | ICD-10-CM

## 2020-11-09 DIAGNOSIS — G4733 Obstructive sleep apnea (adult) (pediatric): Secondary | ICD-10-CM

## 2020-11-09 DIAGNOSIS — G4719 Other hypersomnia: Secondary | ICD-10-CM

## 2020-11-09 DIAGNOSIS — R0683 Snoring: Secondary | ICD-10-CM

## 2020-11-09 DIAGNOSIS — R351 Nocturia: Secondary | ICD-10-CM

## 2020-11-11 NOTE — Addendum Note (Signed)
Addended by: Star Age on: 11/11/2020 05:35 PM   Modules accepted: Orders

## 2020-11-11 NOTE — Progress Notes (Signed)
See procedure note.

## 2020-11-11 NOTE — Procedures (Signed)
[facility]  HOME SLEEP TEST (Watch PAT) REPORT  STUDY DATE: 11/09/20  DOB: June 13, 1990  MRN: [id number]  ORDERING CLINICIAN: Star Age, MD, PhD   REFERRING CLINICIAN: Vivi Barrack, MD   CLINICAL INFORMATION/HISTORY: Shelby Jackson is a 30 year old right-handed woman with an underlying medical history of asthma, allergies, anxiety, depression, and severe obesity with a BMI of over 40, who reports snoring and excessive daytime somnolence as well as nonrestorative sleep.  Epworth sleepiness score: 11/24.  BMI: 44.6 kg/m  Neck Cirumference: 15.75"  FINDINGS:   Sleep Summary:   Total Recording Time (hours, min): 8 H 16 min  Total Sleep Time (hours, min):  5 H 45 min   Percent REM (%):    16.95 %   Respiratory Indices:   Calculated pAHI (per hour): 10.1/hour        REM pAHI:    20.8/hour      NREM pAHI: 7.8/hour  Oxygen Saturation Statistics:    Oxygen Saturation (%) Mean: 96%  Minimum oxygen saturation (%):  86%  O2 Saturation Range (%): 86-100%   O2 Saturation (minutes) <=88%: 0.3 min  Pulse Rate Statistics:   Pulse Mean (bpm):  64/min    Pulse Range (40-114/min)   IMPRESSION: OSA (obstructive sleep apnea), mild  RECOMMENDATION:  This home sleep test demonstrates overall mild obstructive sleep apnea with a total AHI of 10.1/hour and O2 nadir of 86%. Mild to moderate snoring was detected in the beginning of the test, but the snore sensor was lost early in the test. Given the patient's medical history and sleep related complaints, treatment with positive airway pressure can be considered. This can be achieved in the form of autoPAP trial/titration at home. A  full night CPAP titration study will help with proper treatment settings and mask fitting if needed. Alternative treatments include weight loss along with avoidance of the supine sleep position, or an oral appliance in appropriate candidates.   Please note that untreated obstructive  sleep apnea may carry additional perioperative morbidity. Patients with significant obstructive sleep apnea should receive perioperative PAP therapy and the surgeons and particularly the anesthesiologist should be informed of the diagnosis and the severity of the sleep disordered breathing. The patient should be cautioned not to drive, work at heights, or operate dangerous or heavy equipment when tired or sleepy. Review and reiteration of good sleep hygiene measures should be pursued with any patient. Other causes of the patient's symptoms, including circadian rhythm disturbances, an underlying mood disorder, medication effect and/or an underlying medical problem cannot be ruled out based on this test. Clinical correlation is recommended.   The patient and her referring provider will be notified of the test results. The patient will be seen in follow up in sleep clinic at [facility].  I certify that I have reviewed the raw data recording prior to the issuance of this report in accordance with the standards of the American Academy of Sleep Medicine (AASM).  INTERPRETING PHYSICIAN:  Star Age, MD, PhD  Board Certified in Neurology and Sleep Medicine  [facility] [address] [place], [place] [address] [phone]  Sleep Summary  Oxygen Saturation Statistics   Start Study Time: End Study Time: Total Recording Time:         10:32:46 PM       6:49:18 AM 8 h, 16 min  Total Sleep Time % REM of Sleep Time:  5 h, 45 min  17.0    Mean: 96 Minimum: 86 Maximum:  100  Mean of Desaturations Nadirs (%):   92  Oxygen Desatur. %:  4-9 10-20 >20 Total  Events Number Total   19  2 90.5 9.5  0 0.0  21 100.0  Oxygen Saturation: <90 <=88 <85 <80 <70  Duration (minutes): Sleep % 0.4 0.1 0.3 0.0 0.1 0.0 0.0 0.0 0.0 0.0     Respiratory Indices      Total Events REM NREM All Night  pRDI: pAHI 3%: ODI 4%:   pAHIc 3%: % CSR: pAHI 4%:  57  54  21  N/A  0 26  21.8 20.8 9.4  N/A 8.2 7.8 2.7  N/A 10.7 10.1 3.9   N/A  4.9       Pulse Rate Statistics during Sleep (BPM)      Mean: 64 Minimum: 40 Maximum: 114        Body Position Statistics  Position Supine Prone Right Left Non-Supine  Sleep (min) 13.5 7.0 0.0 0.0 7.0  Sleep % 3.9 2.0 0.0 0.0 2.0  pRDI N/A N/A N/A N/A N/A  pAHI 3% N/A N/A N/A N/A N/A  ODI 4% N/A N/A N/A N/A N/A     Snoring Statistics Snoring Level (dB) >40 >50 >60 >70 >80 >Threshold (45)  Sleep (min) 17.2 0.7 0.1 0.0 0.0 1.6  Sleep % 5.0 0.2 0.0 0.0 0.0 0.5    Mean: 40 dB

## 2020-11-11 NOTE — Progress Notes (Signed)
Patient referred by Dr. Jerline Pain, seen by me on 10/18/20, HST on 11/09/20.    Please call and notify the patient that the recent home sleep test showed obstructive sleep apnea. OSA is overall mild, but worth treating to see if she feels better after treatment. To that end I recommend treatment for this in the form of autoPAP, which means, that we don't have to bring her in for a sleep study with CPAP, but will let her try an autoPAP machine at home, through a DME company (of her choice, or as per insurance requirement). The DME representative will educate her on how to use the machine, how to put the mask on, etc. I have placed an order in the chart. Please send referral, talk to patient, send report to referring MD. We will need a FU in sleep clinic for 10 weeks post-PAP set up, please arrange that with me or one of our NPs. Thanks,   Star Age, MD, PhD [facility])

## 2020-11-15 ENCOUNTER — Telehealth: Payer: Self-pay

## 2020-11-15 NOTE — Telephone Encounter (Signed)
I called pt. No answer, left a message asking pt to call me back.   

## 2020-11-15 NOTE — Telephone Encounter (Signed)
-----   Message from Star Age, MD sent at 11/11/2020  5:35 PM EDT ----- Patient referred by Dr. Jerline Pain, seen by me on 10/18/20, HST on 11/09/20.    Please call and notify the patient that the recent home sleep test showed obstructive sleep apnea. OSA is overall mild, but worth treating to see if she feels better after treatment. To that end I recommend treatment for this in the form of autoPAP, which means, that we don't have to bring her in for a sleep study with CPAP, but will let her try an autoPAP machine at home, through a DME company (of her choice, or as per insurance requirement). The DME representative will educate her on how to use the machine, how to put the mask on, etc. I have placed an order in the chart. Please send referral, talk to patient, send report to referring MD. We will need a FU in sleep clinic for 10 weeks post-PAP set up, please arrange that with me or one of our NPs. Thanks,   Star Age, MD, PhD [facility])

## 2020-11-17 NOTE — Telephone Encounter (Signed)
Patient called in returning VM. RN not available and let patient know nurse would give her a call back to discuss sleep study results.

## 2020-11-17 NOTE — Telephone Encounter (Signed)
I called pt. I advised pt that Dr. Rexene Alberts reviewed their sleep study results and found that pt has mild OSA and reccomends pt start an auto-pap for treatment. I reviewed PAP compliance expectations with the pt. Pt is agreeable to starting an auto-PAP. I advised pt that an order will be sent to a DME, [facility], and [facility] will call the pt within about one week after they file with the pt's insurance. [facility] will show the pt how to use the machine, fit for masks, and troubleshoot the auto-PAP if needed. A follow up appt was made for insurance purposes with Dr. Rexene Alberts on 02/10/21 830 am. Pt verbalized understanding to arrive 15 minutes early and bring their auto-PAP. A letter with all of this information in it will be mailed to the pt as a reminder. I verified with the pt that the address we have on file is correct. Pt verbalized understanding of results. Pt had no questions at this time but was encouraged to call back if questions arise. I have sent the order to [facility] and have received confirmation that they have received the order.

## 2020-11-19 NOTE — Progress Notes (Deleted)
GYNECOLOGY  VISIT   HPI: 30 y.o.   Single  African American  female   [id number] with No LMP recorded.   here for     GYNECOLOGIC HISTORY: No LMP recorded. Contraception:  none Menopausal hormone therapy:  none Last mammogram:  n/a Last pap smear:  03-07-18 Neg:Neg HR HPV, 11/2014 normal per patient        OB History     Gravida  0   Para  0   Term  0   Preterm  0   AB  0   Living  0      SAB  0   IAB  0   Ectopic  0   Multiple  0   Live Births  0              Patient Active Problem List   Diagnosis Date Noted   Other allergic rhinitis 11/01/2020   Allergic conjunctivitis of both eyes 11/01/2020   Asthma 11/01/2020   Other adverse food reactions, not elsewhere classified, subsequent encounter 11/01/2020   Adverse effect of other drugs, medicaments and biological substances, subsequent encounter 11/01/2020   Depression, major, single episode, severe ([place]) 10/06/2020   GAD (generalized anxiety disorder) 10/06/2020   Sleep-disordered breathing 09/08/2020   Seasonal allergies 03/02/2020   Gastroesophageal reflux disease without esophagitis 09/26/2018   Leg mass, bilateral 04/19/2018   Mild persistent asthma 04/19/2018   Eczema 04/19/2018   Elevated serum creatinine 04/19/2018    Past Medical History:  Diagnosis Date   Abnormal Pap smear of cervix 2009   Anxiety    Asthma    Bronchitis    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  Current Outpatient Medications  Medication Sig Dispense Refill   acetaminophen (TYLENOL) 500 MG tablet Take 1,000 mg by mouth every 6 (six) hours as needed for moderate pain.     azelastine (ASTELIN) 0.1 % nasal spray Place 1-2 sprays into both nostrils 2 (two) times daily as needed (itchy/watery eyes). Use in each  nostril as directed 30 mL 5   beclomethasone (QVAR) 80 MCG/ACT inhaler Inhale 1 puff into the lungs 2 (two) times daily. 1 each 12   cetirizine (ZYRTEC) 10 MG tablet Take 10 mg by mouth daily as needed for allergies.     escitalopram (LEXAPRO) 10 MG tablet Take 1/2 tab daily for the first 2 weeks and then take 1 tab daily. 30 tablet 2   Multiple Vitamin (MULTIVITAMIN WITH MINERALS) TABS tablet Take 1 tablet by mouth daily. (Patient not taking: Reported on 11/01/2020)     pantoprazole (PROTONIX) 40 MG tablet Take 1 tablet (40 mg total) by mouth daily as needed. 90 tablet 0   polyethylene glycol (MIRALAX) 17 g packet Use 17 gm dose three times daily, with a stool softener, until bowels clear, maximum of 3 consecutive days. 9 each 0   valACYclovir (VALTREX) 500 MG tablet Take 1 tablet (500 mg total) by mouth daily. Increase to 1 tablet bid for 3 days for an outbreak. 110 tablet 3   No current facility-administered medications for this visit.     ALLERGIES: Pineapple, Albuterol, Bactrim [sulfamethoxazole-trimethoprim], Caffeine, Sulfa antibiotics, and Latex  Family History  Problem Relation Age of Onset   Anxiety  disorder Father    Diabetes Father    Heart disease Father    Arthritis Father    Hypothyroidism Paternal Aunt    Heart disease Maternal Grandfather    Hypothyroidism Sister     Social History   Socioeconomic History   Marital status: Single    Spouse name: Not on file   Number of children: Not on file   Years of education: Not on file   Highest education level: Not on file  Occupational History   Not on file  Tobacco Use   Smoking status: Never   Smokeless tobacco: Never  Vaping Use   Vaping Use: Never used  Substance and Sexual Activity   Alcohol use: No   Drug use: No   Sexual activity: Yes    Partners: Male    Birth control/protection: Condom  Other Topics Concern   Not on file  Social History Narrative   Not on file   Social Determinants of Health    Financial Resource Strain: Not on file  Food Insecurity: Not on file  Transportation Needs: Not on file  Physical Activity: Not on file  Stress: Not on file  Social Connections: Not on file  Intimate Partner Violence: Not on file    Review of Systems  PHYSICAL EXAMINATION:    There were no vitals taken for this visit.    General appearance: alert, cooperative and appears stated age Head: Normocephalic, without obvious abnormality, atraumatic Neck: no adenopathy, supple, symmetrical, trachea midline and thyroid normal to inspection and palpation Lungs: clear to auscultation bilaterally Breasts: normal appearance, no masses or tenderness, No nipple retraction or dimpling, No nipple discharge or bleeding, No axillary or supraclavicular adenopathy Heart: regular rate and rhythm Abdomen: soft, non-tender, no masses,  no organomegaly Extremities: extremities normal, atraumatic, no cyanosis or edema Skin: Skin color, texture, turgor normal. No rashes or lesions Lymph nodes: Cervical, supraclavicular, and axillary nodes normal. No abnormal inguinal nodes palpated Neurologic: Grossly normal  Pelvic: External genitalia:  no lesions              Urethra:  normal appearing urethra with no masses, tenderness or lesions              Bartholins and Skenes: normal                 Vagina: normal appearing vagina with normal color and discharge, no lesions              Cervix: no lesions                Bimanual Exam:  Uterus:  normal size, contour, position, consistency, mobility, non-tender              Adnexa: no mass, fullness, tenderness              Rectal exam: {yes no:[id number]}.  Confirms.              Anus:  normal sphincter tone, no lesions  Chaperone was present for exam.  ASSESSMENT     PLAN     An After Visit Summary was printed and given to the patient.  ______ minutes face to face time of which over 50% was spent in counseling.

## 2020-11-22 ENCOUNTER — Ambulatory Visit: Payer: BC Managed Care – PPO | Admitting: Obstetrics and Gynecology

## 2020-11-25 NOTE — Progress Notes (Addendum)
GYNECOLOGY  VISIT   HPI: 30 y.o.   Single  African American  female   [place] with Patient's last menstrual period was 11/26/2020 (exact date).   here for consult. Patient trying for pregnancy x66months. History of elevated TSH.    Seeing her PCP tomorrow about her thyroid.   Patient has had 2 neg.home UPTs recently. Patient felt fatigue and nausea, so she really expected to be pregnant.  Menses are regular.  LMP started on time.  She is using an App to monitor her ovulation.   Partner has not fathered a child.   Patient had an episode of numbness of the left side of her body.  This was attributed to low magnesium level.  Normal brain MRI.   She has now been diagnosed with general anxiety disorder and depression.  Stress causes nausea for her.  She is trying to find a Social worker in [place].   She is working and going to school.  She is thinking about making a job change.   Had Covid twice.   TDap 12/2016.   GYNECOLOGIC HISTORY: Patient's last menstrual period was 11/26/2020 (exact date). Contraception: none Menopausal hormone therapy:  n/a Last mammogram:  n/a Last pap smear:  03-07-18 Neg:Neg HR HPV, 11/2014 normal per patient        OB History     Gravida  0   Para  0   Term  0   Preterm  0   AB  0   Living  0      SAB  0   IAB  0   Ectopic  0   Multiple  0   Live Births  0              Patient Active Problem List   Diagnosis Date Noted   Other allergic rhinitis 11/01/2020   Allergic conjunctivitis of both eyes 11/01/2020   Asthma 11/01/2020   Other adverse food reactions, not elsewhere classified, subsequent encounter 11/01/2020   Adverse effect of other drugs, medicaments and biological substances, subsequent encounter 11/01/2020   Depression, major, single episode, severe ([place]) 10/06/2020   GAD (generalized anxiety disorder) 10/06/2020   Sleep-disordered breathing 09/08/2020   Seasonal allergies 03/02/2020   Gastroesophageal reflux  disease without esophagitis 09/26/2018   Leg mass, bilateral 04/19/2018   Mild persistent asthma 04/19/2018   Eczema 04/19/2018   Elevated serum creatinine 04/19/2018    Past Medical History:  Diagnosis Date   Abnormal Pap smear of cervix 2009   Anxiety    Asthma    Bronchitis    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 No pertinent surgical history.  Current Outpatient Medications  Medication Sig Dispense Refill   azelastine (ASTELIN) 0.1 % nasal spray Place 1-2 sprays into both nostrils 2 (two) times daily as needed (itchy/watery eyes). Use in each nostril as directed 30 mL 5   beclomethasone (QVAR) 80 MCG/ACT inhaler Inhale 1 puff into the lungs 2 (two) times daily. 1 each 12   cetirizine (ZYRTEC) 10 MG tablet Take 10 mg by mouth daily as needed for allergies.     escitalopram (LEXAPRO) 10 MG tablet Take 1/2 tab daily for the first 2 weeks and then take 1 tab daily. 30 tablet 2   Multiple Vitamin (  MULTIVITAMIN WITH MINERALS) TABS tablet Take 1 tablet by mouth daily.     pantoprazole (PROTONIX) 40 MG tablet Take 1 tablet (40 mg total) by mouth daily as needed. 90 tablet 0   polyethylene glycol (MIRALAX) 17 g packet Use 17 gm dose three times daily, with a stool softener, until bowels clear, maximum of 3 consecutive days. 9 each 0   valACYclovir (VALTREX) 500 MG tablet Take 1 tablet (500 mg total) by mouth daily. Increase to 1 tablet bid for 3 days for an outbreak. 110 tablet 3   No current facility-administered medications for this visit.     ALLERGIES: Albuterol, Pineapple, Bactrim [sulfamethoxazole-trimethoprim], Caffeine, Other, Sulfa antibiotics, and Latex  Family History  Problem Relation Age of Onset   Anxiety disorder Father    Diabetes Father    Heart disease Father     Arthritis Father    Hypothyroidism Paternal Aunt    Heart disease Maternal Grandfather    Hypothyroidism Sister     Social History   Socioeconomic History   Marital status: Single    Spouse name: Not on file   Number of children: Not on file   Years of education: Not on file   Highest education level: Not on file  Occupational History   Not on file  Tobacco Use   Smoking status: Never   Smokeless tobacco: Never  Vaping Use   Vaping Use: Never used  Substance and Sexual Activity   Alcohol use: No   Drug use: No   Sexual activity: Yes    Partners: Male    Birth control/protection: Condom  Other Topics Concern   Not on file  Social History Narrative   Not on file   Social Determinants of Health   Financial Resource Strain: Not on file  Food Insecurity: Not on file  Transportation Needs: Not on file  Physical Activity: Not on file  Stress: Not on file  Social Connections: Not on file  Intimate Partner Violence: Not on file    Review of Systems  All other systems reviewed and are negative.  PHYSICAL EXAMINATION:    BP 100/64 (Cuff Size: Large)   Pulse 65   Ht 5' (1.524 m)   Wt 225 lb (102.1 kg)   LMP 11/26/2020 (Exact Date)   SpO2 98%   BMI 43.94 kg/m     General appearance: alert, cooperative and appears stated age           ASSESSMENT  New diagnosis of anxiety and depression.  On Lexapro.  Chronic superficial venous thrombosis of both LEs.  Hx chlamydia.  Hx elevated TSH.  PLAN  Fertility planning reviewed.  Ovulation monitoring discussed.  Risk of ectopic pregnancy reviewed and need for early hCG monitoring.  Start PNV.  Recheck TFTs through PCP.  We discussed the importance of treating subclinical hypothyroidism prior to pregnancy.  Will check Rubella titer today.  Reading material reviewed. She will see a counselor about her depression and anxiety.  Lexapro effect on fetus reviewed.  We talked about her focusing on her health right now prior  to beginning a pregnancy.  Keep appointment for annual exam.   35 min total time was spent for this patient encounter, including preparation, face-to-face counseling with the patient, coordination of care, and documentation of the encounter.

## 2020-11-30 ENCOUNTER — Ambulatory Visit: Payer: BC Managed Care – PPO | Admitting: Obstetrics and Gynecology

## 2020-11-30 ENCOUNTER — Encounter: Payer: Self-pay | Admitting: Obstetrics and Gynecology

## 2020-11-30 ENCOUNTER — Other Ambulatory Visit: Payer: Self-pay

## 2020-11-30 VITALS — BP 100/64 | HR 65 | Ht 60.0 in | Wt 225.0 lb

## 2020-11-30 DIAGNOSIS — Z3169 Encounter for other general counseling and advice on procreation: Secondary | ICD-10-CM

## 2020-12-01 ENCOUNTER — Ambulatory Visit: Payer: BC Managed Care – PPO | Admitting: Family Medicine

## 2020-12-01 DIAGNOSIS — Z0289 Encounter for other administrative examinations: Secondary | ICD-10-CM

## 2020-12-01 LAB — RUBELLA SCREEN: Rubella: 3.66 Index

## 2020-12-01 NOTE — Progress Notes (Incomplete)
   Shelby Jackson is a 30 y.o. female who presents today for an office visit.  Assessment/Plan:  New/Acute Problems:   Chronic Problems Addressed Today: No problem-specific Assessment & Plan notes found for this encounter.     Subjective:  HPI:        Objective:  Physical Exam: LMP 11/26/2020 (Exact Date)   Gen: No acute distress, resting comfortably*** CV: Regular rate and rhythm with no murmurs appreciated Pulm: Normal work of breathing, clear to auscultation bilaterally with no crackles, wheezes, or rhonchi Neuro: Grossly normal, moves all extremities Psych: Normal affect and thought content        I,Jordan Kelly,acting as a scribe for Dimas Chyle, MD.,have documented all relevant documentation on the behalf of Dimas Chyle, MD,as directed by  Dimas Chyle, MD while in the presence of Dimas Chyle, MD. ***  Algis Greenhouse. Jerline Pain, MD 12/01/2020 7:35 AM

## 2020-12-16 ENCOUNTER — Ambulatory Visit: Payer: BC Managed Care – PPO | Admitting: Family Medicine

## 2020-12-16 ENCOUNTER — Other Ambulatory Visit: Payer: Self-pay

## 2020-12-16 ENCOUNTER — Encounter: Payer: Self-pay | Admitting: Family Medicine

## 2020-12-16 VITALS — BP 103/65 | HR 67 | Temp 98.2°F | Ht 60.0 in | Wt 229.0 lb

## 2020-12-16 DIAGNOSIS — R7989 Other specified abnormal findings of blood chemistry: Secondary | ICD-10-CM

## 2020-12-16 DIAGNOSIS — F324 Major depressive disorder, single episode, in partial remission: Secondary | ICD-10-CM

## 2020-12-16 DIAGNOSIS — F411 Generalized anxiety disorder: Secondary | ICD-10-CM

## 2020-12-16 DIAGNOSIS — J3089 Other allergic rhinitis: Secondary | ICD-10-CM

## 2020-12-16 DIAGNOSIS — G4733 Obstructive sleep apnea (adult) (pediatric): Secondary | ICD-10-CM | POA: Diagnosis not present

## 2020-12-16 DIAGNOSIS — R5383 Other fatigue: Secondary | ICD-10-CM | POA: Diagnosis not present

## 2020-12-16 LAB — COMPREHENSIVE METABOLIC PANEL
ALT: 39 U/L — ABNORMAL HIGH (ref 0–35)
AST: 26 U/L (ref 0–37)
Albumin: 4 g/dL (ref 3.5–5.2)
Alkaline Phosphatase: 48 U/L (ref 39–117)
BUN: 11 mg/dL (ref 6–23)
CO2: 30 mEq/L (ref 19–32)
Calcium: 9.5 mg/dL (ref 8.4–10.5)
Chloride: 104 mEq/L (ref 96–112)
Creatinine, Ser: 1.02 mg/dL (ref 0.40–1.20)
GFR: 73.69 mL/min (ref >60.00)
Glucose, Bld: 78 mg/dL (ref 70–99)
Potassium: 4.1 mEq/L (ref 3.5–5.1)
Sodium: 139 mEq/L (ref 135–145)
Total Bilirubin: 0.4 mg/dL (ref 0.2–1.2)
Total Protein: 6.5 g/dL (ref 6.0–8.3)

## 2020-12-16 LAB — CBC
HCT: 38.6 % (ref 36.0–46.0)
Hemoglobin: 13.3 g/dL (ref 12.0–15.0)
MCHC: 34.5 g/dL (ref 30.0–36.0)
MCV: 94.4 fl (ref 78.0–100.0)
Platelets: 171 10*3/uL (ref 150.0–400.0)
RBC: 4.08 Mil/uL (ref 3.87–5.11)
RDW: 13.1 % (ref 11.5–15.5)
WBC: 5.4 10*3/uL (ref 4.0–10.5)

## 2020-12-16 LAB — TSH: TSH: 3.65 u[IU]/mL (ref 0.35–5.50)

## 2020-12-16 LAB — VITAMIN B12: Vitamin B-12: 287 pg/mL (ref 211–911)

## 2020-12-16 LAB — T4, FREE: Free T4: 0.72 ng/dL (ref 0.60–1.60)

## 2020-12-16 LAB — T3, FREE: T3, Free: 3.6 pg/mL (ref 2.3–4.2)

## 2020-12-16 NOTE — Assessment & Plan Note (Signed)
Doing well with Lexapro 10 mg daily.  We will continue current dose.  She is looking to see a therapist.

## 2020-12-16 NOTE — Assessment & Plan Note (Signed)
Doing much better with Lexapro 10 mg daily.  Her PHQ score is still mildly elevated today though it is unclear if this is more depressive symptoms or due to her underlying fatigue.  We will continue current dose.  She is looking to see a therapist as well.

## 2020-12-16 NOTE — Progress Notes (Signed)
Shelby Jackson is a 29 y.o. female who presents today for an office visit.  Assessment/Plan:  New/Acute Problems: Other fatigue We will check labs including TSH, CBC, c-Met, and B12.  She does have underlying sleep apnea but has not yet been treated with CPAP due to supply constraints.  Have her labs are normal we will see how she does with CPAP first.  If still has issues with fatigue beyond this will consider referral to long COVID clinic.  Chronic Problems Addressed Today: GAD (generalized anxiety disorder) Doing well with Lexapro 10 mg daily.  We will continue current dose.  She is looking to see a therapist.  Major depression in partial remission (HCC) Doing much better with Lexapro 10 mg daily.  Her PHQ score is still mildly elevated today though it is unclear if this is more depressive symptoms or due to her underlying fatigue.  We will continue current dose.  She is looking to see a therapist as well.  OSA (obstructive sleep apnea) Managed bySleep medicine.  Waiting on CPAP delivery though this may take several more weeks.      Subjective:  HPI:  Patient here for follow-up.  Was last seen about 3 months ago and was doing well at that time.  Unfortunately since her last visit she had a sudden onset of left-sided numbness and paresthesias.  Was admitted to an outside hospital for stroke evaluation which was negative.  She followed up with neurology had blood work done.  She was found to have mildly elevated TSH to 5.55.  She followed up with another physician about a month after this.  Was having significant issues with anxiety and depression.  Started on Lexapro.  Has done well with this.  Main issue today is ongoing fatigue, lethargy, and brain fog.  Symptoms started about a year and a half ago after being diagnosed with COVID.  It is made it difficult for her to focus and complete work at her job.       Objective:  Physical Exam: BP 103/65   Pulse 67   Temp 98.2 F  (36.8 C) (Temporal)   Ht 5' (1.524 m)   Wt 229 lb (103.9 kg)   LMP 11/26/2020 (Exact Date)   SpO2 99%   BMI 44.72 kg/m   Gen: No acute distress, resting comfortably CV: Regular rate and rhythm with no murmurs appreciated Pulm: Normal work of breathing, clear to auscultation bilaterally with no crackles, wheezes, or rhonchi Neuro: Grossly normal, moves all extremities Psych: Normal affect and thought content      Caleb M. Parker, MD 12/16/2020 11:20 AM   

## 2020-12-16 NOTE — Patient Instructions (Signed)
It was very nice to see you today!  We will check blood work today.  No other changes today.  Hopefully we can get your CPAP machine set up soon.  If we do not have answers we may need to refer you to the [facility].  Take care, Dr Jerline Pain  PLEASE NOTE:  If you had any lab tests please let us know if you have not heard back within a few days. You may see your results on mychart before we have a chance to review them but we will give you a call once they are reviewed by [place]. If we ordered any referrals today, please let us know if you have not heard from their office within the next week.   Please try these tips to maintain a healthy lifestyle:  Eat at least 3 REAL meals and 1-2 snacks per day.  Aim for no more than 5 hours between eating.  If you eat breakfast, please do so within one hour of getting up.   Each meal should contain half fruits/vegetables, one quarter protein, and one quarter carbs (no bigger than a computer mouse)  Cut down on sweet beverages. This includes juice, soda, and sweet tea.   Drink at least 1 glass of water with each meal and aim for at least 8 glasses per day  Exercise at least 150 minutes every week.

## 2020-12-16 NOTE — Assessment & Plan Note (Signed)
Managed [facility].  Waiting on CPAP delivery though this may take several more weeks.

## 2020-12-20 ENCOUNTER — Other Ambulatory Visit: Payer: Self-pay | Admitting: Family Medicine

## 2020-12-20 ENCOUNTER — Other Ambulatory Visit: Payer: Self-pay

## 2020-12-20 DIAGNOSIS — U099 Post covid-19 condition, unspecified: Secondary | ICD-10-CM

## 2020-12-20 NOTE — Progress Notes (Signed)
Please inform patient of the following:  Her labs are all NORMAL - no obvious abnormalities that would explain her symptoms.  Her thryoid levels are back to normal. Her fatigue could be coming from untreated sleep apnea or it could be due to long covid symptoms.  At this point we can either wait and see how she does once she gets the CPAP or we can refer her to the long covid clinic.  Shelby Jackson. Jerline Pain, MD 12/20/2020 8:04 AM

## 2021-02-10 ENCOUNTER — Ambulatory Visit: Payer: Self-pay | Admitting: Neurology

## 2021-02-19 NOTE — Progress Notes (Deleted)
30 y.o. G0P0000 Single African American female here for annual exam.    PCP:     No LMP recorded.           Sexually active: {yes no:314532}  The current method of family planning is {contraception:315051}.    Exercising: {yes no:314532}  {types:19826} Smoker:  {YES NO:22349}  Health Maintenance: Pap:  *** History of abnormal Pap:  {YES NO:22349} MMG:  *** Colonoscopy:  *** BMD:   ***  Result  *** TDaP:  *** Gardasil:   {YES NO:22349} HIV: Hep C: Screening Labs:  Hb today: ***, Urine today: ***   reports that she has never smoked. She has never used smokeless tobacco. She reports that she does not drink alcohol and does not use drugs.  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  Current Outpatient Medications  Medication Sig Dispense Refill   azelastine (ASTELIN) 0.1 % nasal spray Place 1-2 sprays into both nostrils 2 (two) times daily as needed (itchy/watery eyes). Use in each nostril as directed 30 mL 5   beclomethasone (QVAR) 80 MCG/ACT inhaler Inhale 1 puff into the lungs 2 (two) times daily. 1 each 12   cetirizine (ZYRTEC) 10 MG tablet Take 10 mg by mouth daily as needed for allergies.     escitalopram (LEXAPRO) 10 MG tablet Take 1/2 tab daily for the first 2 weeks and then take 1 tab daily. 30 tablet 2   Multiple Vitamin (MULTIVITAMIN WITH MINERALS) TABS tablet Take 1 tablet by mouth daily.     pantoprazole (PROTONIX) 40 MG tablet Take 1 tablet (40 mg total) by mouth daily as needed. 90 tablet 0   polyethylene glycol (MIRALAX) 17 g packet Use 17 gm dose three times daily, with a stool softener, until bowels clear, maximum of 3 consecutive days. 9  each 0   valACYclovir (VALTREX) 500 MG tablet Take 1 tablet (500 mg total) by mouth daily. Increase to 1 tablet bid for 3 days for an outbreak. 110 tablet 3   No current facility-administered medications for this visit.    Family History  Problem Relation Age of Onset   Anxiety disorder Father    Diabetes Father    Heart disease Father    Arthritis Father    Hypothyroidism Paternal Aunt    Heart disease Maternal Grandfather    Hypothyroidism Sister     Review of Systems  Exam:   There were no vitals taken for this visit.    General appearance: alert, cooperative and appears stated age Head: normocephalic, without obvious abnormality, atraumatic Neck: no adenopathy, supple, symmetrical, trachea midline and thyroid normal to inspection and palpation Lungs: clear to auscultation bilaterally Breasts: normal appearance, no masses or tenderness, No nipple retraction or dimpling, No nipple discharge or bleeding, No axillary adenopathy Heart: regular rate and rhythm Abdomen: soft, non-tender; no masses, no organomegaly Extremities: extremities normal, atraumatic, no cyanosis or edema Skin: skin color, texture, turgor normal. No rashes or lesions Lymph nodes: cervical, supraclavicular, and axillary nodes normal. Neurologic: grossly normal  Pelvic: External genitalia:  no lesions  No abnormal inguinal nodes palpated.              Urethra:  normal appearing urethra with no masses, tenderness or lesions              Bartholins and Skenes: normal                 Vagina: normal appearing vagina with normal color and discharge, no lesions              Cervix: no lesions              Pap taken: {yes no:314532} Bimanual Exam:  Uterus:  normal size, contour, position, consistency, mobility, non-tender              Adnexa: no mass, fullness, tenderness              Rectal exam: {yes no:314532}.  Confirms.              Anus:  normal sphincter tone, no lesions  Chaperone was  present for exam:  ***  Assessment:   Well woman visit with gynecologic exam.   Plan: Mammogram screening discussed. Self breast awareness reviewed. Pap and HR HPV as above. Guidelines for Calcium, Vitamin D, regular exercise program including cardiovascular and weight bearing exercise.   Follow up annually and prn.   Additional counseling given.  {yes [id number]. _______ minutes face to face time of which over 50% was spent in counseling.    After visit summary provided.

## 2021-02-22 ENCOUNTER — Ambulatory Visit: Payer: BC Managed Care – PPO | Admitting: Obstetrics and Gynecology

## 2021-02-22 DIAGNOSIS — F4310 Post-traumatic stress disorder, unspecified: Secondary | ICD-10-CM | POA: Diagnosis not present

## 2021-02-22 DIAGNOSIS — F3181 Bipolar II disorder: Secondary | ICD-10-CM | POA: Diagnosis not present

## 2021-03-11 ENCOUNTER — Encounter: Payer: BC Managed Care – PPO | Admitting: Family Medicine

## 2021-03-17 ENCOUNTER — Encounter: Payer: BC Managed Care – PPO | Admitting: Family Medicine

## 2021-03-18 DIAGNOSIS — G4733 Obstructive sleep apnea (adult) (pediatric): Secondary | ICD-10-CM | POA: Diagnosis not present

## 2021-03-22 DIAGNOSIS — F3181 Bipolar II disorder: Secondary | ICD-10-CM | POA: Diagnosis not present

## 2021-03-22 DIAGNOSIS — F4310 Post-traumatic stress disorder, unspecified: Secondary | ICD-10-CM | POA: Diagnosis not present

## 2021-03-30 ENCOUNTER — Ambulatory Visit: Payer: BC Managed Care – PPO | Admitting: Neurology

## 2021-04-18 DIAGNOSIS — G4733 Obstructive sleep apnea (adult) (pediatric): Secondary | ICD-10-CM | POA: Diagnosis not present

## 2021-04-20 ENCOUNTER — Encounter: Payer: BC Managed Care – PPO | Admitting: Family Medicine

## 2021-05-21 DIAGNOSIS — N920 Excessive and frequent menstruation with regular cycle: Secondary | ICD-10-CM | POA: Diagnosis not present

## 2021-05-21 DIAGNOSIS — Z91018 Allergy to other foods: Secondary | ICD-10-CM | POA: Diagnosis not present

## 2021-05-21 DIAGNOSIS — Z91040 Latex allergy status: Secondary | ICD-10-CM | POA: Diagnosis not present

## 2021-05-21 DIAGNOSIS — N3001 Acute cystitis with hematuria: Secondary | ICD-10-CM | POA: Diagnosis not present

## 2021-05-21 DIAGNOSIS — Z888 Allergy status to other drugs, medicaments and biological substances status: Secondary | ICD-10-CM | POA: Diagnosis not present

## 2021-05-21 DIAGNOSIS — N939 Abnormal uterine and vaginal bleeding, unspecified: Secondary | ICD-10-CM | POA: Diagnosis not present

## 2021-05-21 DIAGNOSIS — Z881 Allergy status to other antibiotic agents status: Secondary | ICD-10-CM | POA: Diagnosis not present

## 2021-05-23 ENCOUNTER — Telehealth: Payer: Self-pay | Admitting: Neurology

## 2021-05-23 NOTE — Telephone Encounter (Signed)
Dr. Rexene Alberts out 01/03- LVM and mychart msg sent requesting pt cb to r/s.

## 2021-05-27 ENCOUNTER — Encounter: Payer: BC Managed Care – PPO | Admitting: Family Medicine

## 2021-06-09 ENCOUNTER — Institutional Professional Consult (permissible substitution): Payer: BC Managed Care – PPO | Admitting: Pulmonary Disease

## 2021-06-14 ENCOUNTER — Ambulatory Visit: Payer: BC Managed Care – PPO | Admitting: Neurology

## 2021-07-18 NOTE — Progress Notes (Deleted)
GYNECOLOGY  VISIT   HPI: 31 y.o.   Single  African American  female   G0P0000 with No LMP recorded.   here for missed menses.   GYNECOLOGIC HISTORY: No LMP recorded. Contraception:  none Menopausal hormone therapy:  n/a Last mammogram:  n/a Last pap smear:    03-07-18 Neg:Neg HR HPV, 11/2014 normal per patient        OB History     Gravida  0   Para  0   Term  0   Preterm  0   AB  0   Living  0      SAB  0   IAB  0   Ectopic  0   Multiple  0   Live Births  0              Patient Active Problem List   Diagnosis Date Noted   Other allergic rhinitis 11/01/2020   Allergic conjunctivitis of both eyes 11/01/2020   Asthma 11/01/2020   Other adverse food reactions, not elsewhere classified, subsequent encounter 11/01/2020   Adverse effect of other drugs, medicaments and biological substances, subsequent encounter 11/01/2020   Major depression in partial remission ([place]) 10/06/2020   GAD (generalized anxiety disorder) 10/06/2020   OSA (obstructive sleep apnea) 09/08/2020   Seasonal allergies 03/02/2020   Gastroesophageal reflux disease without esophagitis 09/26/2018   Leg mass, bilateral 04/19/2018   Mild persistent asthma 04/19/2018   Eczema 04/19/2018   Elevated serum creatinine 04/19/2018    Past Medical History:  Diagnosis Date   Abnormal Pap smear of cervix 2009   Anxiety    Asthma    Bronchitis    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  Current Outpatient Medications  Medication Sig Dispense Refill   azelastine (ASTELIN) 0.1 % nasal spray Place 1-2 sprays into both nostrils 2 (two) times daily as needed (itchy/watery eyes). Use in each nostril as directed 30 mL 5   beclomethasone (QVAR) 80 MCG/ACT inhaler Inhale 1 puff into the lungs 2  (two) times daily. 1 each 12   cetirizine (ZYRTEC) 10 MG tablet Take 10 mg by mouth daily as needed for allergies.     escitalopram (LEXAPRO) 10 MG tablet Take 1/2 tab daily for the first 2 weeks and then take 1 tab daily. 30 tablet 2   Multiple Vitamin (MULTIVITAMIN WITH MINERALS) TABS tablet Take 1 tablet by mouth daily.     pantoprazole (PROTONIX) 40 MG tablet Take 1 tablet (40 mg total) by mouth daily as needed. 90 tablet 0   polyethylene glycol (MIRALAX) 17 g packet Use 17 gm dose three times daily, with a stool softener, until bowels clear, maximum of 3 consecutive days. 9 each 0   valACYclovir (VALTREX) 500 MG tablet Take 1 tablet (500 mg total) by mouth daily. Increase to 1 tablet bid for 3 days for an outbreak. 110 tablet 3   No current facility-administered medications for this visit.     ALLERGIES: Albuterol, Pineapple, Bactrim [sulfamethoxazole-trimethoprim], Caffeine, Other, Sulfa antibiotics, and Latex  Family History  Problem Relation Age of Onset   Anxiety disorder Father    Diabetes Father    Heart disease Father    Arthritis Father    Hypothyroidism Paternal Aunt  Heart disease Maternal Grandfather    Hypothyroidism Sister     Social History   Socioeconomic History   Marital status: Single    Spouse name: Not on file   Number of children: Not on file   Years of education: Not on file   Highest education level: Not on file  Occupational History   Not on file  Tobacco Use   Smoking status: Never   Smokeless tobacco: Never  Vaping Use   Vaping Use: Never used  Substance and Sexual Activity   Alcohol use: No   Drug use: No   Sexual activity: Yes    Partners: Male    Birth control/protection: Condom  Other Topics Concern   Not on file  Social History Narrative   Not on file   Social Determinants of Health   Financial Resource Strain: Not on file  Food Insecurity: Not on file  Transportation Needs: Not on file  Physical Activity: Not on file   Stress: Not on file  Social Connections: Not on file  Intimate Partner Violence: Not on file    Review of Systems  PHYSICAL EXAMINATION:    There were no vitals taken for this visit.    General appearance: alert, cooperative and appears stated age Head: Normocephalic, without obvious abnormality, atraumatic Neck: no adenopathy, supple, symmetrical, trachea midline and thyroid normal to inspection and palpation Lungs: clear to auscultation bilaterally Breasts: normal appearance, no masses or tenderness, No nipple retraction or dimpling, No nipple discharge or bleeding, No axillary or supraclavicular adenopathy Heart: regular rate and rhythm Abdomen: soft, non-tender, no masses,  no organomegaly Extremities: extremities normal, atraumatic, no cyanosis or edema Skin: Skin color, texture, turgor normal. No rashes or lesions Lymph nodes: Cervical, supraclavicular, and axillary nodes normal. No abnormal inguinal nodes palpated Neurologic: Grossly normal  Pelvic: External genitalia:  no lesions              Urethra:  normal appearing urethra with no masses, tenderness or lesions              Bartholins and Skenes: normal                 Vagina: normal appearing vagina with normal color and discharge, no lesions              Cervix: no lesions                Bimanual Exam:  Uterus:  normal size, contour, position, consistency, mobility, non-tender              Adnexa: no mass, fullness, tenderness              Rectal exam: {yes no:314532}.  Confirms.              Anus:  normal sphincter tone, no lesions  Chaperone was present for exam:  ***  ASSESSMENT     PLAN     An After Visit Summary was printed and given to the patient.  ______ minutes face to face time of which over 50% was spent in counseling.

## 2021-07-19 ENCOUNTER — Ambulatory Visit: Payer: BC Managed Care – PPO | Admitting: Obstetrics and Gynecology

## 2021-08-02 NOTE — Progress Notes (Signed)
GYNECOLOGY  VISIT   HPI: 31 y.o.   Single  African American  female   Shelby Jackson with Patient's last menstrual period was 07/08/2021 (exact date).   here for irregular menses. Patient's cycle began 07-08-21 which was 20 days late. Had normal menses. Home UPT neg.    Spotting off and on for one month.  Wearing pads or panty line almost every day.  Her LMP started 07/08/21. Prior was 05/19/21.  Periods prior to this have been irregular, meaning she is getting her cycle every 5 weeks and not every 4 weeks.  These were lasting 5 days and no bleeding in between cycles.   She is having some significant pain with the current bleeding, nausea, dizziness, fatigues, and headaches.  Pain is bilateral lower abdomen, where her fibroids are, per patient.  Taking ibuprofen 400 mg to 600 mg and using essential oils and heat.  She is avoiding pregnancy now.  She is using condoms.  She has hx of thrombosis of her LEs.  Micronor causes her to feel aggressive.  No partner change.   Stressed and not taking her Lexapro regularly.  She denies suicidal ideation.  She has a Social worker.   Working the the [employer] society in [place].   GYNECOLOGIC HISTORY: Patient's last menstrual period was 07/08/2021 (exact date). Contraception:  none/condoms sometimes Menopausal hormone therapy:  n/a Last mammogram:  n/a Last pap smear:    03-07-18 Neg:Neg HR HPV, 11/2014 normal per patient        OB History     Gravida  0   Para  0   Term  0   Preterm  0   AB  0   Living  0      SAB  0   IAB  0   Ectopic  0   Multiple  0   Live Births  0              Patient Active Problem List   Diagnosis Date Noted   Other allergic rhinitis 11/01/2020   Allergic conjunctivitis of both eyes 11/01/2020   Asthma 11/01/2020   Other adverse food reactions, not elsewhere classified, subsequent encounter 11/01/2020   Adverse effect of other drugs, medicaments and biological substances, subsequent encounter 11/01/2020    Major depression in partial remission ([place]) 10/06/2020   GAD (generalized anxiety disorder) 10/06/2020   OSA (obstructive sleep apnea) 09/08/2020   Seasonal allergies 03/02/2020   Gastroesophageal reflux disease without esophagitis 09/26/2018   Leg mass, bilateral 04/19/2018   Mild persistent asthma 04/19/2018   Eczema 04/19/2018   Elevated serum creatinine 04/19/2018    Past Medical History:  Diagnosis Date   Abnormal Pap smear of cervix 2009   Anxiety    Asthma    Bronchitis    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 No pertinent surgical history.  Current Outpatient Medications  Medication Sig Dispense Refill   azelastine (ASTELIN) 0.1 % nasal spray Place 1-2 sprays into both nostrils 2 (two) times daily as needed (itchy/watery eyes). Use in each nostril as directed 30 mL 5   beclomethasone (QVAR) 80 MCG/ACT inhaler Inhale 1 puff into the lungs 2 (two) times daily. 1 each 12   cetirizine (ZYRTEC) 10 MG tablet Take 10 mg by mouth daily  as needed for allergies.     escitalopram (LEXAPRO) 10 MG tablet Take 1/2 tab daily for the first 2 weeks and then take 1 tab daily. 30 tablet 2   Multiple Vitamin (MULTIVITAMIN WITH MINERALS) TABS tablet Take 1 tablet by mouth daily.     pantoprazole (PROTONIX) 40 MG tablet Take 1 tablet (40 mg total) by mouth daily as needed. 90 tablet 0   polyethylene glycol (MIRALAX) 17 g packet Use 17 gm dose three times daily, with a stool softener, until bowels clear, maximum of 3 consecutive days. 9 each 0   valACYclovir (VALTREX) 500 MG tablet Take 1 tablet (500 mg total) by mouth daily. Increase to 1 tablet bid for 3 days for an outbreak. 110 tablet 3   No current facility-administered medications for this visit.     ALLERGIES: Albuterol, Pineapple, Bactrim  [sulfamethoxazole-trimethoprim], Caffeine, Other, Sulfa antibiotics, and Latex  Family History  Problem Relation Age of Onset   Anxiety disorder Father    Diabetes Father    Heart disease Father    Arthritis Father    Hypothyroidism Paternal Aunt    Heart disease Maternal Grandfather    Hypothyroidism Sister     Social History   Socioeconomic History   Marital status: Single    Spouse name: Not on file   Number of children: Not on file   Years of education: Not on file   Highest education level: Not on file  Occupational History   Not on file  Tobacco Use   Smoking status: Never   Smokeless tobacco: Never  Vaping Use   Vaping Use: Never used  Substance and Sexual Activity   Alcohol use: No   Drug use: No   Sexual activity: Yes    Partners: Male    Birth control/protection: Condom  Other Topics Concern   Not on file  Social History Narrative   Not on file   Social Determinants of Health   Financial Resource Strain: Not on file  Food Insecurity: Not on file  Transportation Needs: Not on file  Physical Activity: Not on file  Stress: Not on file  Social Connections: Not on file  Intimate Partner Violence: Not on file    Review of Systems  Genitourinary:  Positive for menstrual problem.  All other systems reviewed and are negative.  PHYSICAL EXAMINATION:    BP 110/80    Pulse 85    Ht 5' (1.524 m)    Wt 225 lb (102.1 kg)    LMP 07/08/2021 (Exact Date)    SpO2 98%    BMI 43.94 kg/m     General appearance: alert, cooperative and appears stated age   Pelvic: External genitalia:  no lesions              Urethra:  normal appearing urethra with no masses, tenderness or lesions              Bartholins and Skenes: normal                 Vagina: normal appearing vagina with normal color and discharge, no lesions              Cervix: palpably normal.  Unable to see with speculum.                 Bimanual Exam:  Uterus:  normal size, contour, position, consistency,  mobility, non-tender.  Cervix is very anterior and behind the subic synthesis.  Adnexa: no mass, fullness, tenderness    Chaperone was present for exam:  Lovena Le, CMA  ASSESSMENT  Irregular menses.  Probable anovulatory cycle. Hx fibroids.  Stress.   PLAN  UPT- negative.  Provera 10 mg x 10 days.  Instructed in use and side effects.  If irregular bleeding persists or recurs, will do pelvic US.  Annual exam in 2 months. She will reach out to her team prescribing the Lexapro and reach out to her therapist.    An After Visit Summary was printed and given to the patient.  25 min  total time was spent for this patient encounter, including preparation, face-to-face counseling with the patient, coordination of care, and documentation of the encounter.

## 2021-08-03 ENCOUNTER — Encounter: Payer: Self-pay | Admitting: Obstetrics and Gynecology

## 2021-08-03 ENCOUNTER — Other Ambulatory Visit: Payer: Self-pay

## 2021-08-03 ENCOUNTER — Ambulatory Visit (INDEPENDENT_AMBULATORY_CARE_PROVIDER_SITE_OTHER): Payer: Managed Care, Other (non HMO) | Admitting: Obstetrics and Gynecology

## 2021-08-03 VITALS — BP 110/80 | HR 85 | Ht 60.0 in | Wt 225.0 lb

## 2021-08-03 DIAGNOSIS — F439 Reaction to severe stress, unspecified: Secondary | ICD-10-CM | POA: Diagnosis not present

## 2021-08-03 DIAGNOSIS — N926 Irregular menstruation, unspecified: Secondary | ICD-10-CM

## 2021-08-03 LAB — PREGNANCY, URINE: Preg Test, Ur: NEGATIVE

## 2021-08-03 MED ORDER — MEDROXYPROGESTERONE ACETATE 10 MG PO TABS: 10.0000 mg | ORAL_TABLET | Freq: Every day | ORAL | 0 refills | Status: DC

## 2021-08-18 ENCOUNTER — Ambulatory Visit (INDEPENDENT_AMBULATORY_CARE_PROVIDER_SITE_OTHER): Payer: Self-pay | Admitting: Pulmonary Disease

## 2021-08-18 ENCOUNTER — Encounter: Payer: Self-pay | Admitting: Pulmonary Disease

## 2021-08-18 ENCOUNTER — Other Ambulatory Visit: Payer: Self-pay

## 2021-08-18 VITALS — BP 134/76 | HR 65 | Temp 98.2°F | Ht 60.0 in | Wt 210.0 lb

## 2021-08-18 DIAGNOSIS — R5383 Other fatigue: Secondary | ICD-10-CM

## 2021-08-18 DIAGNOSIS — R413 Other amnesia: Secondary | ICD-10-CM

## 2021-08-18 DIAGNOSIS — R0609 Other forms of dyspnea: Secondary | ICD-10-CM

## 2021-08-18 LAB — CBC WITH DIFFERENTIAL/PLATELET
Basophils Absolute: 0 10*3/uL (ref 0.0–0.1)
Basophils Relative: 0.5 % (ref 0.0–3.0)
Eosinophils Absolute: 0 10*3/uL (ref 0.0–0.7)
Eosinophils Relative: 0 % (ref 0.0–5.0)
HCT: 38.6 % (ref 36.0–46.0)
Hemoglobin: 12.9 g/dL (ref 12.0–15.0)
Lymphocytes Relative: 37 % (ref 12.0–46.0)
Lymphs Abs: 2 10*3/uL (ref 0.7–4.0)
MCHC: 33.4 g/dL (ref 30.0–36.0)
MCV: 94.5 fl (ref 78.0–100.0)
Monocytes Absolute: 0.5 10*3/uL (ref 0.1–1.0)
Monocytes Relative: 8.9 % (ref 3.0–12.0)
Neutro Abs: 2.9 10*3/uL (ref 1.4–7.7)
Neutrophils Relative %: 53.6 % (ref 43.0–77.0)
Platelets: 177 10*3/uL (ref 150.0–400.0)
RBC: 4.08 Mil/uL (ref 3.87–5.11)
RDW: 13.7 % (ref 11.5–15.5)
WBC: 5.3 10*3/uL (ref 4.0–10.5)

## 2021-08-18 LAB — VITAMIN D 25 HYDROXY (VIT D DEFICIENCY, FRACTURES): VITD: 13.13 ng/mL — ABNORMAL LOW (ref 30.00–100.00)

## 2021-08-18 NOTE — Progress Notes (Signed)
Her Vit D is low, may explain some but not all of her symptoms. Do you mind coordinating treatment and follow up for this? I referred to neurology for memory loss. Not sure if much I can do to help but will follow up with her in a few months.

## 2021-08-18 NOTE — Progress Notes (Signed)
$'@Patient'T$  ID: Shelby Jackson, female    DOB: [birth year], 31 y.o.   MRN: [id number]  Chief Complaint  Patient presents with   Consult    Pt is here for a consult for long covid symptoms. Memory fog, not remembering things, on and off headaches, sleep issues and focus problems. This has been occurring since 2020.     Referring provider: Vivi Barrack, MD  HPI:   31 y.o. woman whom we are seeing in consultation for evaluation of fatigue and possible long-haul COVID symptoms.  Note from PCP x2 reviewed.  Patient reports contracted COVID 3 years ago.  Ever since then has had issues with severe fatigue, memory loss.  Hard to remember things told to Shelby Jackson in terms of short-term memory.  Feels like anything told to Shelby Jackson within 2 to 5 minutes she forgets.  Has to write everything down.  She is fatigued.  Headache throughout the day.  No time of day when things are better or worse.  No rhyme or reason to the headaches.  No position to make things better or worse.  Not reliably in the morning but sometimes.  She was diagnosed with sleep apnea 12/2020.  Did receive CPAP machine, auto titrating.  She is not using this currently.  Supplied with nasal pillows but unable to tolerate this.  She denies significant dyspnea.  No real change in Shelby Jackson breathing.  She was quite dyspneic with COVID diagnosis many years ago.  However now baseline dyspnea unchanged.  She is on Qvar for Shelby Jackson asthma.  She is unable to tolerate albuterol she has allergic-like reaction with tingling in Shelby Jackson throat, some swelling around Shelby Jackson oral cavity per Shelby Jackson report.  Following up with allergy doctors who prescribed and maintain Shelby Jackson asthma medications regarding this phenomenon.  Review chest x-ray x3  08/2018, 06/28/2019, 07/01/2019 but all 3 are interpreted as clear lungs bilaterally without evidence of pneumothorax, effusion, other abnormality.  PMH: Asthma, depression Surgical history:History reviewed. No pertinent surgical history. 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  Epworth:  No flowsheet data found.  Tests:   FENO:  No results found for: NITRICOXIDE  PFT: Spirometry 10/2020 personally reviewed interpreted as no fixed obstruction, FVC suggestive of moderate restriction versus air trapping, flow volume curves do not appear totally repeatable so difficult to interpret.  WALK:  No flowsheet data found.  Imaging: No results found.  Lab Results: Personally reviewed CBC    Component Value Date/Time   WBC 5.4 12/16/2020 1119   RBC 4.08 12/16/2020 1119   HGB 13.3 12/16/2020 1119   HGB 13.8 02/18/2020 1514   HCT 38.6 12/16/2020 1119   HCT 40.0 02/18/2020 1514   PLT 171.0 12/16/2020 1119   PLT 189 02/18/2020 1514   MCV 94.4 12/16/2020 1119   MCV 95 02/18/2020 1514   MCH 32.7 02/18/2020 1514   MCH 32.2 01/25/2019 1157   MCHC 34.5 12/16/2020 1119   RDW 13.1 12/16/2020 1119   RDW 12.9 02/18/2020 1514   LYMPHSABS 1.3 10/25/2012 2129   MONOABS 0.6 10/25/2012 2129   EOSABS 0.0 10/25/2012 2129   BASOSABS 0.0 10/25/2012 2129    BMET    Component Value Date/Time   NA 139 12/16/2020 1119  NA 140 02/25/2020 1625   K 4.1 12/16/2020 1119   CL 104 12/16/2020 1119   CO2 30 12/16/2020 1119   GLUCOSE 78 12/16/2020 1119   BUN 11 12/16/2020 1119   BUN 14 02/25/2020 1625   CREATININE 1.02 12/16/2020 1119   CREATININE 0.95 03/23/2020 1142   CALCIUM 9.5 12/16/2020 1119   GFRNONAA 64 02/25/2020 1625   GFRAA 74 02/25/2020 1625    BNP No results found for: BNP  ProBNP No results found for: PROBNP  Specialty Problems       Pulmonary Problems   Mild persistent asthma   OSA (obstructive sleep apnea)   Asthma    Other allergic rhinitis    Allergies  Allergen Reactions   Albuterol Anaphylaxis    Anaphylaxis.  Anaphylaxis.  Anaphylaxis.    Pineapple    Bactrim [Sulfamethoxazole-Trimethoprim] Other (See Comments)    Body aches   Caffeine Other (See Comments)    Veins popped out of head Veins popped out of head Other reaction(s): Tremor (intolerance) Veins popped out of head   Other Other (See Comments)    Body aches.   Sulfa Antibiotics Other (See Comments)    Body aches.   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 Use in each nostril as directed   beclomethasone (QVAR) 80 MCG/ACT inhaler Inhale 1 puff into the lungs 2 (two) times daily.   cetirizine (ZYRTEC) 10 MG tablet Take 10 mg by mouth daily as needed for allergies.   escitalopram (LEXAPRO) 10 MG tablet Take 1/2 tab daily for the first 2 weeks and then take 1 tab daily.   medroxyPROGESTERone (PROVERA) 10 MG tablet Take 1 tablet (10 mg total) by mouth daily. Take for 10 days.   Multiple Vitamin (MULTIVITAMIN WITH MINERALS) TABS tablet Take 1 tablet by mouth daily.   pantoprazole (PROTONIX) 40 MG tablet  Take 1 tablet (40 mg total) by mouth daily as needed.   polyethylene glycol (MIRALAX) 17 g packet Use 17 gm dose three times daily, with a stool softener, until bowels clear, maximum of 3 consecutive days.   valACYclovir (VALTREX) 500 MG tablet Take 1 tablet (500 mg total) by mouth daily. Increase to 1 tablet bid for 3 days for an outbreak.   No facility-administered encounter medications on file as of 08/18/2021.     Review of Systems  Review of Systems  No chest pain with exertion.  No orthopnea or PND.  Denies lower extremity swelling.  Comprehensive review of systems otherwise negative. Physical Exam  BP 134/76 (BP Location: Left Arm, Patient Position: Sitting, Cuff Size: Normal)  Pulse 65    Temp 98.2 F (36.8 C) (Oral)    Ht 5' (1.524 m)    Wt 210 lb (95.3 kg)    SpO2 98%    BMI 41.01 kg/m   Wt Readings from Last 5 Encounters:  08/18/21 210 lb (95.3 kg)  08/03/21 225 lb (102.1 kg)  12/16/20 229 lb (103.9 kg)  11/30/20 225 lb (102.1 kg)  11/01/20 225 lb 9.6 oz (102.3 kg)    BMI Readings from Last 5 Encounters:  08/18/21 41.01 kg/m  08/03/21 43.94 kg/m  12/16/20 44.72 kg/m  11/30/20 43.94 kg/m  11/01/20 42.63 kg/m     Physical Exam  General: Sitting in chair, in no acute distress Eyes: EOMI, icterus Neck: Supple, no JVP Pulmonary: Clear, normal work of breathing Cardiovascular: Regular rhythm, no murmurs Abdomen: Not on distended, bowel sounds present MSK: No synovitis, no joint effusion Neuro: Normal gait, no weakness Psych: Normal mood, full affect  Assessment & Plan:   Fatigue, memory loss: Approximate 3 years since diagnosis of COVID.  Possible long-haul COVID symptoms.  OSA possibly contributing, not tolerating CPAP, not using.  Encouraged Shelby Jackson to trial again, see below.  Difficult to treat.  CBC, vitamin D today for further evaluation.  Referral to neurology for memory loss issues.  Asthma: Longstanding diagnosis.  Reports allergic reaction to  albuterol.  Encouraged Shelby Jackson to follow-up with allergy doctor for further evaluation.  Continue Qvar for now.  Asthma symptoms are stable per Shelby Jackson report.  Arlyce Harman 10/2020 without fixed obstruction, FVC suggestive gas trapping vs restriction, FV curves not very repeatable so hard to interpret.  Consider full PFTs in the future, escalation to ICS/LABA therapy if symptoms worsen.  OSA: diagnosed 10/2020. Auto CPAP with nasal pillows.  She says she cannot tolerate this.  DME order for mask fitting today.  Encouraged Shelby Jackson to contact Shelby Jackson OSA doctor (neurology) for further evaluation.  May benefit from oral appliance if cannot tolerate CPAP.  BMI appears over threshold for inspire device.   Return in about 3 months (around 11/18/2021).   Lanier Clam, MD 08/18/2021

## 2021-08-18 NOTE — Patient Instructions (Addendum)
Nice to see you ? ?I think CPAP is worth another try ? ?I will send order for mask fitting ? ?Recommend contacting your sleep doctor to follow up, consider oral appliance if CPAP does not work.  ? ?I am going to check some blood work today to further evalute the fatigue, memory issues. ? ?I will send a referral to neurology for memory. ? ?Return to clinic in 3 months or sooner as needed. ? ?

## 2021-08-22 ENCOUNTER — Other Ambulatory Visit: Payer: Self-pay

## 2021-08-22 MED ORDER — VITAMIN D (ERGOCALCIFEROL) 1.25 MG (50000 UNIT) PO CAPS: 50000.0000 [IU] | ORAL_CAPSULE | ORAL | 3 refills | Status: DC

## 2021-09-26 NOTE — Progress Notes (Deleted)
31 y.o. G0P0000 Single African American female here for annual exam.    PCP:     No LMP recorded.           Sexually active: {yes no:314532}  The current method of family planning is {contraception:315051}.    Exercising: {yes no:314532}  {types:19826} Smoker:  no  Health Maintenance: Pap:   03-07-18 Neg:Neg HR HPV, 11/2014 normal per patient History of abnormal Pap:  Yes, 2009, but follow up pap normal. No treatment. MMG:   01-14-10 Lt.Br.US/Neg/BiRads1 Colonoscopy:  n/a BMD:   n/a  Result  n/a TDaP:  01-02-17 Gardasil:   yes HIV:02-18-20 NR Hep C:02-18-20 Neg Screening Labs:  Hb today: ***, Urine today: ***   reports that she has never smoked. She has never used smokeless tobacco. She reports that she does not drink alcohol and does not use drugs.  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  Current Outpatient Medications  Medication Sig Dispense Refill   azelastine (ASTELIN) 0.1 % nasal spray Place 1-2 sprays into both nostrils 2 (two) times daily as needed (itchy/watery eyes). Use in each nostril as directed 30 mL 5   beclomethasone (QVAR) 80 MCG/ACT inhaler Inhale 1 puff into the lungs 2 (two) times daily. 1 each 12   cetirizine (ZYRTEC) 10 MG tablet Take 10 mg by mouth daily as needed for allergies.     escitalopram (LEXAPRO) 10 MG tablet Take 1/2 tab daily for the first 2 weeks and then take 1 tab daily. 30 tablet 2   medroxyPROGESTERone (PROVERA) 10 MG tablet Take 1 tablet (10 mg total) by mouth daily. Take for 10 days. 10 tablet 0   Multiple Vitamin (MULTIVITAMIN WITH MINERALS) TABS tablet Take 1 tablet by mouth daily.     pantoprazole (PROTONIX) 40 MG  tablet Take 1 tablet (40 mg total) by mouth daily as needed. 90 tablet 0   polyethylene glycol (MIRALAX) 17 g packet Use 17 gm dose three times daily, with a stool softener, until bowels clear, maximum of 3 consecutive days. 9 each 0   valACYclovir (VALTREX) 500 MG tablet Take 1 tablet (500 mg total) by mouth daily. Increase to 1 tablet bid for 3 days for an outbreak. 110 tablet 3   Vitamin D, Ergocalciferol, (DRISDOL) 1.25 MG (50000 UNIT) CAPS capsule Take 1 capsule (50,000 Units total) by mouth every 7 (seven) days. 5 capsule 3   No current facility-administered medications for this visit.    Family History  Problem Relation Age of Onset   Anxiety disorder Father    Diabetes Father    Heart disease Father    Arthritis Father    Hypothyroidism Paternal Aunt    Heart disease Maternal Grandfather    Hypothyroidism Sister     Review of Systems  Exam:   There were no vitals taken for this visit.    General appearance: alert, cooperative and appears stated age Head: normocephalic, without obvious abnormality, atraumatic Neck: no adenopathy, supple, symmetrical, trachea midline and thyroid normal to inspection and palpation Lungs: clear to auscultation bilaterally Breasts: normal appearance, no masses or  tenderness, No nipple retraction or dimpling, No nipple discharge or bleeding, No axillary adenopathy Heart: regular rate and rhythm Abdomen: soft, non-tender; no masses, no organomegaly Extremities: extremities normal, atraumatic, no cyanosis or edema Skin: skin color, texture, turgor normal. No rashes or lesions Lymph nodes: cervical, supraclavicular, and axillary nodes normal. Neurologic: grossly normal  Pelvic: External genitalia:  no lesions              No abnormal inguinal nodes palpated.              Urethra:  normal appearing urethra with no masses, tenderness or lesions              Bartholins and Skenes: normal                 Vagina: normal appearing vagina with normal  color and discharge, no lesions              Cervix: no lesions              Pap taken: {yes no:314532} Bimanual Exam:  Uterus:  normal size, contour, position, consistency, mobility, non-tender              Adnexa: no mass, fullness, tenderness              Rectal exam: {yes no:314532}.  Confirms.              Anus:  normal sphincter tone, no lesions  Chaperone was present for exam:  ***  Assessment:   Well woman visit with gynecologic exam.   Plan: Mammogram screening discussed. Self breast awareness reviewed. Pap and HR HPV as above. Guidelines for Calcium, Vitamin D, regular exercise program including cardiovascular and weight bearing exercise.   Follow up annually and prn.   Additional counseling given.  {yes [id number]. _______ minutes face to face time of which over 50% was spent in counseling.    After visit summary provided.

## 2021-09-29 ENCOUNTER — Ambulatory Visit: Payer: Managed Care, Other (non HMO) | Admitting: Obstetrics and Gynecology

## 2021-10-20 ENCOUNTER — Encounter: Payer: Self-pay | Admitting: Family Medicine

## 2021-10-20 NOTE — Telephone Encounter (Signed)
Pt calling office to schedule yearly physical and requesting dosage change on Lexapro please advise ?

## 2021-10-21 ENCOUNTER — Other Ambulatory Visit: Payer: Self-pay

## 2021-10-21 MED ORDER — ESCITALOPRAM OXALATE 20 MG PO TABS: 20.0000 mg | ORAL_TABLET | Freq: Every day | ORAL | 0 refills | Status: DC

## 2021-10-21 NOTE — Telephone Encounter (Signed)
New Rx sent with increased dosage and called pt to advise lvm with cb number  ?

## 2021-10-21 NOTE — Telephone Encounter (Signed)
Ok to increase dose. ? ?Shelby Jackson. Jerline Pain, MD ?10/21/2021 12:34 PM  ? ?

## 2021-10-25 ENCOUNTER — Encounter: Payer: Self-pay | Admitting: Family Medicine

## 2021-11-13 ENCOUNTER — Other Ambulatory Visit: Payer: Self-pay | Admitting: Family Medicine

## 2021-11-21 NOTE — Progress Notes (Deleted)
31 y.o. G0P0000 Single African American female here for annual exam.    PCP: Dimas Chyle, MD  No LMP recorded.           Sexually active: {yes no:314532}  The current method of family planning is {contraception:315051}.    Exercising: {yes no:314532}  {types:19826} Smoker:  no  Health Maintenance: Pap:   03-07-18 Neg:Neg HR HPV, 11/2014 normal per patient History of abnormal Pap:  {YES NO:22349} MMG:  n/a Colonoscopy:  n/a BMD:   n/a  Result  n/a TDaP:  01-02-17 Gardasil:   ***completed 2 of 3? HIV:02-18-20 NR Hep C:02-18-20 Neg Screening Labs:  Hb today: ***, Urine today: ***   reports that she has never smoked. She has never used smokeless tobacco. She reports that she does not drink alcohol and does not use drugs.  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  Current Outpatient Medications  Medication Sig Dispense Refill   azelastine (ASTELIN) 0.1 % nasal spray Place 1-2 sprays into both nostrils 2 (two) times daily as needed (itchy/watery eyes). Use in each nostril as directed 30 mL 5   beclomethasone (QVAR) 80 MCG/ACT inhaler Inhale 1 puff into the lungs 2 (two) times daily. 1 each 12   cetirizine (ZYRTEC) 10 MG tablet Take 10 mg by mouth daily as needed for allergies.     escitalopram (LEXAPRO) 20 MG tablet TAKE 1 TABLET BY MOUTH EVERY DAY 30 tablet 0   medroxyPROGESTERone (PROVERA) 10 MG tablet Take 1 tablet (10 mg total) by mouth daily. Take for 10 days. 10 tablet 0   Multiple Vitamin (MULTIVITAMIN WITH MINERALS) TABS tablet Take 1 tablet by mouth daily.     pantoprazole (PROTONIX) 40 MG tablet Take 1 tablet (40 mg total) by mouth daily as needed. 90  tablet 0   polyethylene glycol (MIRALAX) 17 g packet Use 17 gm dose three times daily, with a stool softener, until bowels clear, maximum of 3 consecutive days. 9 each 0   valACYclovir (VALTREX) 500 MG tablet Take 1 tablet (500 mg total) by mouth daily. Increase to 1 tablet bid for 3 days for an outbreak. 110 tablet 3   Vitamin D, Ergocalciferol, (DRISDOL) 1.25 MG (50000 UNIT) CAPS capsule Take 1 capsule (50,000 Units total) by mouth every 7 (seven) days. 5 capsule 3   No current facility-administered medications for this visit.    Family History  Problem Relation Age of Onset   Anxiety disorder Father    Diabetes Father    Heart disease Father    Arthritis Father    Hypothyroidism Paternal Aunt    Heart disease Maternal Grandfather    Hypothyroidism Sister     Review of Systems  Exam:   There were no vitals taken for this visit.    General appearance: alert, cooperative and appears stated age Head: normocephalic, without obvious abnormality, atraumatic Neck: no adenopathy, supple, symmetrical, trachea midline and thyroid normal to inspection and palpation Lungs: clear to auscultation bilaterally Breasts: normal appearance, no masses or tenderness, No nipple retraction or dimpling, No nipple discharge or bleeding, No axillary adenopathy  Heart: regular rate and rhythm Abdomen: soft, non-tender; no masses, no organomegaly Extremities: extremities normal, atraumatic, no cyanosis or edema Skin: skin color, texture, turgor normal. No rashes or lesions Lymph nodes: cervical, supraclavicular, and axillary nodes normal. Neurologic: grossly normal  Pelvic: External genitalia:  no lesions              No abnormal inguinal nodes palpated.              Urethra:  normal appearing urethra with no masses, tenderness or lesions              Bartholins and Skenes: normal                 Vagina: normal appearing vagina with normal color and discharge, no lesions              Cervix: no  lesions              Pap taken: {yes no:314532} Bimanual Exam:  Uterus:  normal size, contour, position, consistency, mobility, non-tender              Adnexa: no mass, fullness, tenderness              Rectal exam: {yes no:314532}.  Confirms.              Anus:  normal sphincter tone, no lesions  Chaperone was present for exam:  ***  Assessment:   Well woman visit with gynecologic exam.   Plan: Mammogram screening discussed. Self breast awareness reviewed. Pap and HR HPV as above. Guidelines for Calcium, Vitamin D, regular exercise program including cardiovascular and weight bearing exercise.   Follow up annually and prn.   Additional counseling given.  {yes [id number]. _______ minutes face to face time of which over 50% was spent in counseling.    After visit summary provided.

## 2021-11-22 ENCOUNTER — Ambulatory Visit: Payer: Managed Care, Other (non HMO) | Admitting: Obstetrics and Gynecology

## 2021-11-22 ENCOUNTER — Other Ambulatory Visit: Payer: Self-pay

## 2021-11-22 DIAGNOSIS — J45909 Unspecified asthma, uncomplicated: Secondary | ICD-10-CM

## 2021-11-22 DIAGNOSIS — J4530 Mild persistent asthma, uncomplicated: Secondary | ICD-10-CM

## 2021-11-23 ENCOUNTER — Ambulatory Visit: Payer: Managed Care, Other (non HMO) | Admitting: Pulmonary Disease

## 2021-12-13 ENCOUNTER — Encounter: Payer: Self-pay | Admitting: Family Medicine

## 2021-12-14 NOTE — Telephone Encounter (Signed)
Pt immunization records left at front desk.Pt will pick up on 12/15/21.

## 2022-03-06 ENCOUNTER — Encounter: Payer: Self-pay | Admitting: *Deleted

## 2022-05-25 ENCOUNTER — Encounter: Payer: Self-pay | Admitting: *Deleted

## 2022-06-12 DIAGNOSIS — R7989 Other specified abnormal findings of blood chemistry: Secondary | ICD-10-CM

## 2022-06-12 DIAGNOSIS — R790 Abnormal level of blood mineral: Secondary | ICD-10-CM

## 2022-06-12 DIAGNOSIS — R7309 Other abnormal glucose: Secondary | ICD-10-CM

## 2022-06-12 HISTORY — DX: Abnormal level of blood mineral: R79.0

## 2022-06-12 HISTORY — DX: Other specified abnormal findings of blood chemistry: R79.89

## 2022-06-12 HISTORY — DX: Other abnormal glucose: R73.09

## 2023-01-29 ENCOUNTER — Encounter: Payer: Self-pay | Admitting: Student

## 2023-03-14 ENCOUNTER — Telehealth: Payer: Self-pay

## 2023-03-14 NOTE — Telephone Encounter (Signed)
I recommend patient be seen in an Emergency Department due to her heavy bleeding and need for probable blood work.   She can be seen in follow up here.

## 2023-03-14 NOTE — Telephone Encounter (Signed)
Pt notified and voiced understanding. Routing to provider for final review and closing encounter.

## 2023-03-14 NOTE — Telephone Encounter (Signed)
Pt LVM in triage line stating that she was transferred to [place] by appt desk due to trying to make an emergency appt w/ Dr. Edward Jolly for heavy bleeding/cramping and fever of 101.49F this AM but had gone down to 99.66F since then.   States ER visit on 01/26/2023 (care everywhere) due to pain informed her she had some fibroids and cysts present.   Pt c/o having to change pad q1hr with painful cramping. Wasn't supposed to start cycle until 10/11 but prior to starting the heavy bleeding, had some pink like spotting for the past 3 weeks.   Pt reports noticing some body weakness and had some shivering last night.   Please advise.

## 2023-03-29 NOTE — Progress Notes (Signed)
GYNECOLOGY  VISIT   HPI: 32 y.o.   Single  African American  female   G0P0000 with Patient's last menstrual period was 03/27/2023.   here for   ER f/u for abnormal uterine bleeding - on and off through Sept 9th-Oct 15th. Pt is concerned this could be a miscarriage.  Seen in ER visit on 03/14/23 for heavy menstruation, pelvic pain, and fever.  Her hemoglobin was 12.5.  Neg hCG.  Covid/flu negative.  No tx given.   She had been previously seen in the ER on 01/26/23 for pelvic and abdominal pain and had a pelvic US then showing multiple fibroids 4.4 - 9.0 cm in length. She had a 4.4 x 3.6 x 4.5 cm simple left ovarian cyst with no evidence of torsion.   Menses every 32 - 46 days.  Last 2 cycles:  03/11/23 and again 03/27/23. Cycles last 5 - 6 days.  Pad change every 30 min and staining clothing.  Having painful periods through entire cycle, which have worsened.  Taking Tylenol which eases the pain.   Hx fibroids on pelvic US 03/21/18:  16 mm and 31 mm.   Had neg HR HPV, normal pap/negGC/CT/trich testing on 03/05/23 at [facility] office visit.   Is trying for pregnancy.  Penetration causes a sense that there is an obstruction.   Graduating from graduate school.  GYNECOLOGIC HISTORY: Patient's last menstrual period was 03/27/2023. Contraception:  provera Menopausal hormone therapy:  n/a Last mammogram:  n/a Last pap smear:   03/07/18 neg: HR HPV neg        OB History     Gravida  0   Para  0   Term  0   Preterm  0   AB  0   Living  0      SAB  0   IAB  0   Ectopic  0   Multiple  0   Live Births  0              Patient Active Problem List   Diagnosis Date Noted   Other allergic rhinitis 11/01/2020   Allergic conjunctivitis of both eyes 11/01/2020   Asthma 11/01/2020   Other adverse food reactions, not elsewhere classified, subsequent encounter 11/01/2020   Adverse effect of other drugs, medicaments and biological substances, subsequent encounter 11/01/2020    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/06/2020   GAD (generalized anxiety disorder) 10/06/2020   OSA (obstructive sleep apnea) 09/08/2020   Seasonal allergies 03/02/2020   Gastroesophageal reflux disease without esophagitis 09/26/2018   Leg mass, bilateral 04/19/2018   Mild persistent asthma 04/19/2018   Eczema 04/19/2018   Elevated serum creatinine 04/19/2018    Past Medical History:  Diagnosis Date   Abnormal Pap smear of cervix 2009   Anxiety    Asthma    Bronchitis    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 No pertinent surgical history.  Current Outpatient Medications  Medication Sig Dispense Refill   azelastine (ASTELIN) 0.1 % nasal spray Place 1-2 sprays into both nostrils 2 (two) times daily as needed (itchy/watery eyes). Use in each nostril as directed 30 mL 5   beclomethasone (QVAR) 80 MCG/ACT inhaler Inhale 1 puff into the lungs 2 (two) times daily. 1 each 12  cetirizine (ZYRTEC) 10 MG tablet Take 10 mg by mouth daily as needed for allergies.     HYDROcodone-acetaminophen (NORCO/VICODIN) 5-325 MG tablet Take 1 tablet by mouth every 6 (six) hours as needed.     medroxyPROGESTERone (PROVERA) 10 MG tablet Take 1 tablet (10 mg total) by mouth daily. Take for 10 days. 10 tablet 0   Multiple Vitamin (MULTIVITAMIN WITH MINERALS) TABS tablet Take 1 tablet by mouth daily.     pantoprazole (PROTONIX) 40 MG tablet Take 1 tablet (40 mg total) by mouth daily as needed. 90 tablet 0   polyethylene glycol (MIRALAX) 17 g packet Use 17 gm dose three times daily, with a stool softener, until bowels clear, maximum of 3 consecutive days. 9 each 0   Vitamin D, Ergocalciferol, (DRISDOL) 1.25 MG (50000 UNIT) CAPS capsule Take 1 capsule (50,000 Units total) by mouth every 7 (seven) days. 5  capsule 3   No current facility-administered medications for this visit.     ALLERGIES: Albuterol, Pineapple, Bactrim [sulfamethoxazole-trimethoprim], Caffeine, Other, Sulfa antibiotics, and Latex  Family History  Problem Relation Age of Onset   Anxiety disorder Father    Diabetes Father    Heart disease Father    Arthritis Father    Hypothyroidism Paternal Aunt    Heart disease Maternal Grandfather    Hypothyroidism Sister     Social History   Socioeconomic History   Marital status: Single    Spouse name: Not on file   Number of children: Not on file   Years of education: Not on file   Highest education level: Not on file  Occupational History   Not on file  Tobacco Use   Smoking status: Never   Smokeless tobacco: Never  Vaping Use   Vaping status: Never Used  Substance and Sexual Activity   Alcohol use: No   Drug use: No   Sexual activity: Yes    Partners: Male    Birth control/protection: Condom  Other Topics Concern   Not on file  Social History Narrative   Not on file   Social Determinants of Health   Financial Resource Strain: High Risk (02/28/2023)   Received from [facility]   Overall Financial Resource Strain (CARDIA)    Difficulty of Paying Living Expenses: Very hard  Food Insecurity: Medium Risk (04/06/2023)   Received from [facility]   Hunger Vital Sign    Worried About Running Out of Food in the Last Year: Sometimes true    Ran Out of Food in the Last Year: Sometimes true  Transportation Needs: No Transportation Needs (04/06/2023)   Received from [facility]    In the past 12 months, has lack of reliable transportation kept you from medical appointments, meetings, work or from getting things needed for daily living? : No  Physical Activity: Insufficiently Active (02/28/2023)   Received from [facility]   Exercise Vital Sign    Days of Exercise per Week: 3 days    Minutes of Exercise per Session: 30 min  Stress:  Stress Concern Present (02/28/2023)   Received from [facility] - Occupational Stress Questionnaire    Feeling of Stress : To some extent  Social Connections: Socially Integrated (02/28/2023)   Received from [facility]   Social Network    How would you rate your social network (family, work, friends)?: Good participation with social networks  Intimate Partner Violence: Not At Risk (02/28/2023)   Received from [facility]   HITS    Over the last 12 months how often did your partner physically hurt you?: 1    Over the last 12 months how often did your partner insult you or talk down to you?: 1    Over the last 12 months how often did your partner threaten you with physical harm?: 1    Over the last 12 months how often did your partner scream or curse at you?: 1    Review of Systems  All other systems reviewed and are negative.   PHYSICAL EXAMINATION:    BP 130/86 (BP Location: Left Arm, Patient Position: Sitting, Cuff Size: Normal)   Pulse 69   Wt 222 lb (100.7 kg)   LMP 03/27/2023   SpO2 99%   BMI 43.36 kg/m     General appearance: alert, cooperative and appears stated age  Abdomen: soft, mass to umbilicus minus 4 cm.  Nontender.   Pelvic: External genitalia:  no lesions              Urethra:  normal appearing urethra with no masses, tenderness or lesions              Bartholins and Skenes: normal                 Vagina: normal appearing vagina with normal color and discharge, no lesions              Cervix: no lesions                Bimanual Exam:  Uterus:  16 week size uterus, nontender.               Adnexa: no mass, fullness, tenderness              Rectal exam: Yes.  .  Confirms.              Anus:  normal sphincter tone, no lesions  Chaperone was present for exam:  Warren Lacy, CMA  ASSESSMENT  Symptomatic enlarging multifibroid uterus.  Desire for future pregnancy.  Hx superficial thrombosis of LEs. Hx chlamydia.    PLAN  We reviewed her recent ER visits, her [facility] visit, and her pelvic US report and compared it to her prior pelvic US in 2019.  I do recommend a follow up US for more specific documentation of size and location of her fibroids.  Will check serum hCG today.  We discussed potential for laparoscopic myomectomy and assessment of patency of her fallopian tubes.  Chlamydia infection can cause tubal scarring and increase risk of ectopic pregnancy.  She is aware that I am not currently doing surgery and that I am able to refer her to a colleague to perform surgery if necessary.  28 min  total time was spent for this patient encounter, including preparation, face-to-face counseling with the patient, coordination of care, and documentation of the encounter.

## 2023-04-12 ENCOUNTER — Ambulatory Visit (INDEPENDENT_AMBULATORY_CARE_PROVIDER_SITE_OTHER): Payer: Medicaid Other | Admitting: Obstetrics and Gynecology

## 2023-04-12 ENCOUNTER — Encounter: Payer: Self-pay | Admitting: Obstetrics and Gynecology

## 2023-04-12 VITALS — BP 130/86 | HR 69 | Wt 222.0 lb

## 2023-04-12 DIAGNOSIS — D219 Benign neoplasm of connective and other soft tissue, unspecified: Secondary | ICD-10-CM

## 2023-04-12 DIAGNOSIS — N926 Irregular menstruation, unspecified: Secondary | ICD-10-CM | POA: Diagnosis not present

## 2023-04-13 LAB — HCG, QUANTITATIVE, PREGNANCY: HCG, Total, QN: <5 m[IU]/mL

## 2023-04-14 NOTE — Patient Instructions (Signed)
Uterine Fibroids  Uterine fibroids, also called leiomyomas, are noncancerous (benign) tumors that can grow in the uterus. They can cause heavy menstrual bleeding and pain. Fibroids may also grow in the fallopian tubes, cervix, or tissues (ligaments) near the uterus. You may have one or many fibroids. Fibroids vary in size, weight, and where they grow in the uterus. Some can become quite large. Most fibroids do not require medical treatment. What are the causes? The cause of this condition is not known. What increases the risk? You are more likely to develop this condition if you: Are in your 30s or 40s and have not gone through menopause. Have a family history of this condition. Are of African American descent. Started your menstrual period at age 73 or younger. Have never given birth. Are overweight or obese. What are the signs or symptoms? Many women do not have any symptoms. Symptoms of this condition may include: Heavy menstrual bleeding. Bleeding between menstrual periods. Pain and pressure in the pelvic area, between your hip bones. Pain during sex. Bladder problems, such as needing to urinate right away or more often than usual. Inability to have children (infertility). Failure to carry pregnancy to term (miscarriage). How is this diagnosed? This condition may be diagnosed based on: Your symptoms and medical history. A physical exam. A pelvic exam that includes feeling for any tumors. Imaging tests, such as ultrasound or MRI. How is this treated? Treatment for this condition may include follow-up visits with your health care provider to monitor your fibroids for any changes. Other treatment may include: Medicines, such as: Medicines to relieve pain, including aspirin and NSAIDs, such as ibuprofen or naproxen. Hormone therapy. Treatment may be given as a pill or an injection, or it may be inserted into the uterus using an intrauterine device (IUD). Surgery that would do one of  the following: Remove the fibroids (myomectomy). This may be recommended if fibroids affect your fertility and you want to become pregnant. Remove the uterus (hysterectomy). Block the blood supply to the fibroids (uterine artery embolization). This can cause them to shrink and die. Follow these instructions at home: Medicines Take over-the-counter and prescription medicines only as told by your health care provider. Ask your health care provider if you should take iron pills or eat more iron-rich foods, such as dark green, leafy vegetables. Heavy menstrual bleeding can cause low iron levels. Managing pain If directed, apply heat to your back or abdomen to reduce pain. Use the heat source that your health care provider recommends, such as a moist heat pack or a heating pad. To apply heat: Place a towel between your skin and the heat source. Leave the heat on for 20-30 minutes. Remove the heat if your skin turns bright red. This is especially important if you are unable to feel pain, heat, or cold. You may have a greater risk of getting burned.  General instructions Pay close attention to your menstrual cycle. Tell your health care provider about any changes, such as: Heavier bleeding that requires you to change your pads or tampons more than usual. A change in the number of days that your menstrual period lasts. A change in symptoms that come with your menstrual period, such as back pain or cramps in your abdomen. Keep all follow-up visits. This is important, especially if your fibroids need to be monitored for any changes. Contact a health care provider if you: Have pelvic pain, back pain, or cramps in your abdomen that do not get better with  medicine or heat. Develop new bleeding between menstrual periods. Have increased bleeding during or between menstrual periods. Feel more tired or weak than usual. Feel light-headed. Get help right away if you: Faint. Have pelvic pain that suddenly  gets worse. Have severe vaginal bleeding that soaks a tampon or pad in 30 minutes or less. Summary Uterine fibroids are noncancerous (benign) tumors that can develop in the uterus. The exact cause of this condition is not known. Most fibroids do not require medical treatment unless they affect your ability to have children (fertility). Contact a health care provider if you have pelvic pain, back pain, or cramps in your abdomen that do not get better with medicines. Get help right away if you faint, have pelvic pain that suddenly gets worse, or have severe vaginal bleeding. This information is not intended to replace advice given to you by your health care provider. Make sure you discuss any questions you have with your health care provider. Document Revised: 12/30/2019 Document Reviewed: 12/30/2019 Elsevier Patient Education  2024 [employer].

## 2023-05-28 NOTE — Progress Notes (Unsigned)
32 y.o. G0P0000 Single African American female here for annual exam.  Pt wants to discuss feeling blockage and discomfort during IC.  Has urinary frequency and some low back pain.   Patient has a multifibroid uterus about 16 week size.  Her pelvic US is scheduled for 08/02/23.   Her period in November started 04/23/23 and lasted 6 days.  Heavy flow and constant cramping.   Changed her pad 3 - 4 times per days.   Her cycle in December that lasted 6 days.  Less clotting.   Feels tired and not as functional this week.   No partner change.   Some PMS changes. Took Lexapro in the past for anxiety and depression. Had GI upset.  She will have a new insurance starting in February.   PCP: Ardith Dark, MD   Patient's last menstrual period was 05/18/2023.     Period Cycle (Days): 28 Period Duration (Days): 5-6 Period Pattern: Regular Menstrual Flow: Heavy Menstrual Control: Maxi pad Dysmenorrhea: (!) Severe     Sexually active: Yes.    The current method of family planning is provera/condom.    Menopausal hormone therapy:  n/a Exercising: No.   Smoker:  no  OB History  Gravida Para Term Preterm AB Living  0 0 0 0 0 0  SAB IAB Ectopic Multiple Live Births  0 0 0 0 0     HEALTH MAINTENANCE: Last 2 paps:  03/07/18 neg: HR HPV neg  History of abnormal Pap or positive HPV:  no Mammogram:   n/a Colonoscopy:  n/a Bone Density:  n/a  Result  n/a   Immunization History  Administered Date(s) Administered   HPV 9-valent 01/02/2017, 03/07/2018   PFIZER(Purple Top)SARS-COV-2 Vaccination 11/20/2019, 12/19/2019   PPD Test 01/13/2022   Td 09/15/2004   Tdap 01/02/2017      reports that she has never smoked. She has never used smokeless tobacco. She reports that she does not drink alcohol and does not use drugs.  Past Medical History:  Diagnosis Date   Abnormal Pap smear of cervix 2009   Anxiety    Asthma    Bronchitis    Chronic superficial venous thrombosis of both  lower extremities    Depression    Eczema    Elevated serum creatinine    Fibroids    H/O chlamydia infection 07/2012   Recurrent upper respiratory infection (URI)     History reviewed. No pertinent surgical history.  Current Outpatient Medications  Medication Sig Dispense Refill   azelastine (ASTELIN) 0.1 % nasal spray Place 1-2 sprays into both nostrils 2 (two) times daily as needed (itchy/watery eyes). Use in each nostril as directed 30 mL 5   beclomethasone (QVAR) 80 MCG/ACT inhaler Inhale 1 puff into the lungs 2 (two) times daily. 1 each 12   cetirizine (ZYRTEC) 10 MG tablet Take 10 mg by mouth daily as needed for allergies.     HYDROcodone-acetaminophen (NORCO/VICODIN) 5-325 MG tablet Take 1 tablet by mouth every 6 (six) hours as needed.     medroxyPROGESTERone (PROVERA) 10 MG tablet Take 1 tablet (10 mg total) by mouth daily. Take for 10 days. 10 tablet 0   Multiple Vitamin (MULTIVITAMIN WITH MINERALS) TABS tablet Take 1 tablet by mouth daily.     pantoprazole (PROTONIX) 40 MG tablet Take 1 tablet (40 mg total) by mouth daily as needed. 90 tablet 0   polyethylene glycol (MIRALAX) 17 g packet Use 17 gm dose three times daily, with a stool  softener, until bowels clear, maximum of 3 consecutive days. 9 each 0   No current facility-administered medications for this visit.    ALLERGIES: Albuterol, Pineapple, Bactrim [sulfamethoxazole-trimethoprim], Caffeine, Other, Sulfa antibiotics, and Latex  Family History  Problem Relation Age of Onset   Anxiety disorder Father    Diabetes Father    Heart disease Father    Arthritis Father    Hypothyroidism Paternal Aunt    Heart disease Maternal Grandfather    Hypothyroidism Sister     Review of Systems  All other systems reviewed and are negative.   PHYSICAL EXAM:  BP 134/80 (BP Location: Left Arm, Patient Position: Sitting, Cuff Size: Large)   Pulse 87   Ht 5' 1.5" (1.562 m)   Wt 220 lb (99.8 kg)   LMP 05/18/2023   SpO2 96%    BMI 40.90 kg/m     General appearance: alert, cooperative and appears stated age Head: normocephalic, without obvious abnormality, atraumatic Neck: no adenopathy, supple, symmetrical, trachea midline and thyroid normal to inspection and palpation Lungs: clear to auscultation bilaterally Breasts: normal appearance, no masses or tenderness, No nipple retraction or dimpling, No nipple discharge or bleeding, No axillary adenopathy Heart: regular rate and rhythm Abdomen: soft, non-tender; no masses, no organomegaly Extremities: extremities normal, atraumatic, no cyanosis or edema Skin: skin color, texture, turgor normal. No rashes or lesions Lymph nodes: cervical, supraclavicular, and axillary nodes normal. Neurologic: grossly normal  Pelvic: External genitalia:  no lesions              No abnormal inguinal nodes palpated.              Urethra:  normal appearing urethra with no masses, tenderness or lesions              Bartholins and Skenes: normal                 Vagina: normal appearing vagina with normal color and discharge, no lesions              Cervix: no lesions              Pap taken: {yes no:314532} Bimanual Exam:  Uterus:  normal size, contour, position, consistency, mobility, non-tender              Adnexa: no mass, fullness, tenderness              Rectal exam: {yes no:314532}.  Confirms.              Anus:  normal sphincter tone, no lesions  Chaperone was present for exam:  {BSCHAPERONE:31226::"Emily F, CMA"}  ASSESSMENT: Well woman visit with gynecologic exam Hx chlamydia.  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 ***  PLAN: Mammogram screening discussed. Self breast awareness reviewed. Pap and HRV collected:  {yes no:314532} Guidelines for Calcium, Vitamin D, regular exercise program including cardiovascular and weight bearing exercise. Medication refills:  *** {LABS (Optional):23779} Follow up:  ***    Additional counseling given.  {yes [id number]. ***  total time  was spent for this patient encounter, including preparation, face-to-face counseling with the patient, coordination of care, and documentation of the encounter in addition to doing the well woman visit with gynecologic exam.

## 2023-05-31 NOTE — Progress Notes (Deleted)
GYNECOLOGY  VISIT   HPI: 32 y.o.   Single  African American female   G0P0000 with No LMP recorded.   here for: U/S consult     GYNECOLOGIC HISTORY: No LMP recorded. Contraception:  condom/provera Menopausal hormone therapy:  n/a Last 2 paps:  03/07/18 neg: HR HPV neg  History of abnormal Pap or positive HPV:  no Mammogram:  n/a        OB History     Gravida  0   Para  0   Term  0   Preterm  0   AB  0   Living  0      SAB  0   IAB  0   Ectopic  0   Multiple  0   Live Births  0              Patient Active Problem List   Diagnosis Date Noted   Other allergic rhinitis 11/01/2020   Allergic conjunctivitis of both eyes 11/01/2020   Asthma 11/01/2020   Other adverse food reactions, not elsewhere classified, subsequent encounter 11/01/2020   Adverse effect of other drugs, medicaments and biological substances, subsequent encounter 11/01/2020   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/06/2020   GAD (generalized anxiety disorder) 10/06/2020   OSA (obstructive sleep apnea) 09/08/2020   Seasonal allergies 03/02/2020   Gastroesophageal reflux disease without esophagitis 09/26/2018   Leg mass, bilateral 04/19/2018   Mild persistent asthma 04/19/2018   Eczema 04/19/2018   Elevated serum creatinine 04/19/2018    Past Medical History:  Diagnosis Date   Abnormal Pap smear of cervix 2009   Anxiety    Asthma    Bronchitis    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  Current Outpatient Medications  Medication Sig Dispense Refill   azelastine (ASTELIN) 0.1 % nasal spray Place 1-2 sprays into both nostrils 2 (two) times daily as needed (itchy/watery eyes). Use in each nostril as directed 30 mL 5   beclomethasone (QVAR) 80 MCG/ACT inhaler Inhale  1 puff into the lungs 2 (two) times daily. 1 each 12   cetirizine (ZYRTEC) 10 MG tablet Take 10 mg by mouth daily as needed for allergies.     HYDROcodone-acetaminophen (NORCO/VICODIN) 5-325 MG tablet Take 1 tablet by mouth every 6 (six) hours as needed.     medroxyPROGESTERone (PROVERA) 10 MG tablet Take 1 tablet (10 mg total) by mouth daily. Take for 10 days. 10 tablet 0   Multiple Vitamin (MULTIVITAMIN WITH MINERALS) TABS tablet Take 1 tablet by mouth daily.     pantoprazole (PROTONIX) 40 MG tablet Take 1 tablet (40 mg total) by mouth daily as needed. 90 tablet 0   polyethylene glycol (MIRALAX) 17 g packet Use 17 gm dose three times daily, with a stool softener, until bowels clear, maximum of 3 consecutive days. 9 each 0   Vitamin D, Ergocalciferol, (DRISDOL) 1.25 MG (50000 UNIT) CAPS capsule Take 1 capsule (50,000 Units total) by mouth every 7 (seven) days. 5 capsule 3   No current facility-administered medications for this visit.     ALLERGIES: Albuterol, Pineapple, Bactrim [sulfamethoxazole-trimethoprim], Caffeine, Other, Sulfa antibiotics, and Latex  Family History  Problem Relation Age of Onset   Anxiety disorder Father  Diabetes Father    Heart disease Father    Arthritis Father    Hypothyroidism Paternal Aunt    Heart disease Maternal Grandfather    Hypothyroidism Sister     Social History   Socioeconomic History   Marital status: Single    Spouse name: Not on file   Number of children: Not on file   Years of education: Not on file   Highest education level: Not on file  Occupational History   Not on file  Tobacco Use   Smoking status: Never   Smokeless tobacco: Never  Vaping Use   Vaping status: Never Used  Substance and Sexual Activity   Alcohol use: No   Drug use: No   Sexual activity: Yes    Partners: Male    Birth control/protection: Condom  Other Topics Concern   Not on file  Social History Narrative   Not on file   Social Drivers of Health    Financial Resource Strain: High Risk (02/28/2023)   Received from [facility]   Overall Financial Resource Strain (CARDIA)    Difficulty of Paying Living Expenses: Very hard  Food Insecurity: Medium Risk (04/06/2023)   Received from [facility]   Hunger Vital Sign    Worried About Running Out of Food in the Last Year: Sometimes true    Ran Out of Food in the Last Year: Sometimes true  Transportation Needs: No Transportation Needs (04/06/2023)   Received from [facility]    In the past 12 months, has lack of reliable transportation kept you from medical appointments, meetings, work or from getting things needed for daily living? : No  Physical Activity: Insufficiently Active (02/28/2023)   Received from [facility]   Exercise Vital Sign    Days of Exercise per Week: 3 days    Minutes of Exercise per Session: 30 min  Stress: Stress Concern Present (02/28/2023)   Received from [facility] of Occupational Health - Occupational Stress Questionnaire    Feeling of Stress : To some extent  Social Connections: Socially Integrated (02/28/2023)   Received from [facility]   Social Network    How would you rate your social network (family, work, friends)?: Good participation with social networks  Intimate Partner Violence: Not At Risk (02/28/2023)   Received from [facility]   HITS    Over the last 12 months how often did your partner physically hurt you?: Never    Over the last 12 months how often did your partner insult you or talk down to you?: Never    Over the last 12 months how often did your partner threaten you with physical harm?: Never    Over the last 12 months how often did your partner scream or curse at you?: Never    Review of Systems  PHYSICAL EXAMINATION:   There were no vitals taken for this visit.    General appearance: alert, cooperative and appears stated age Head: Normocephalic, without obvious abnormality,  atraumatic Neck: no adenopathy, supple, symmetrical, trachea midline and thyroid normal to inspection and palpation Lungs: clear to auscultation bilaterally Breasts: normal appearance, no masses or tenderness, No nipple retraction or dimpling, No nipple discharge or bleeding, No axillary or supraclavicular adenopathy Heart: regular rate and rhythm Abdomen: soft, non-tender, no masses,  no organomegaly Extremities: extremities normal, atraumatic, no cyanosis or edema Skin: Skin color, texture, turgor normal. No rashes or lesions Lymph nodes: Cervical, supraclavicular, and axillary nodes normal.  No abnormal inguinal nodes palpated Neurologic: Grossly normal  Pelvic: External genitalia:  no lesions              Urethra:  normal appearing urethra with no masses, tenderness or lesions              Bartholins and Skenes: normal                 Vagina: normal appearing vagina with normal color and discharge, no lesions              Cervix: no lesions                Bimanual Exam:  Uterus:  normal size, contour, position, consistency, mobility, non-tender              Adnexa: no mass, fullness, tenderness              Rectal exam: {yes no:314532}.  Confirms.              Anus:  normal sphincter tone, no lesions  Chaperone was present for exam:  {BSCHAPERONE:31226::"Excell Neyland F, CMA"}  ASSESSMENT:    PLAN:    {LABS (Optional):23779}  ***  total time was spent for this patient encounter, including preparation, face-to-face counseling with the patient, coordination of care, and documentation of the encounter.

## 2023-06-11 ENCOUNTER — Other Ambulatory Visit (HOSPITAL_COMMUNITY)
Admission: RE | Admit: 2023-06-11 | Discharge: 2023-06-11 | Disposition: A | Discharge status: Home / Self Care | Payer: 59 | Source: Ambulatory Visit | Attending: Obstetrics and Gynecology | Admitting: Obstetrics and Gynecology

## 2023-06-11 ENCOUNTER — Encounter: Payer: Self-pay | Admitting: Obstetrics and Gynecology

## 2023-06-11 ENCOUNTER — Ambulatory Visit (INDEPENDENT_AMBULATORY_CARE_PROVIDER_SITE_OTHER): Payer: 59 | Admitting: Obstetrics and Gynecology

## 2023-06-11 VITALS — BP 134/80 | HR 87 | Ht 61.5 in | Wt 220.0 lb

## 2023-06-11 DIAGNOSIS — B009 Herpesviral infection, unspecified: Secondary | ICD-10-CM

## 2023-06-11 DIAGNOSIS — Z124 Encounter for screening for malignant neoplasm of cervix: Secondary | ICD-10-CM | POA: Diagnosis present

## 2023-06-11 DIAGNOSIS — Z1159 Encounter for screening for other viral diseases: Secondary | ICD-10-CM

## 2023-06-11 DIAGNOSIS — R5383 Other fatigue: Secondary | ICD-10-CM | POA: Diagnosis not present

## 2023-06-11 DIAGNOSIS — Z113 Encounter for screening for infections with a predominantly sexual mode of transmission: Secondary | ICD-10-CM | POA: Diagnosis not present

## 2023-06-11 DIAGNOSIS — Z01419 Encounter for gynecological examination (general) (routine) without abnormal findings: Secondary | ICD-10-CM | POA: Diagnosis not present

## 2023-06-11 DIAGNOSIS — Z114 Encounter for screening for human immunodeficiency virus [HIV]: Secondary | ICD-10-CM

## 2023-06-11 MED ORDER — NAPROXEN SODIUM 550 MG PO TABS: 550.0000 mg | ORAL_TABLET | Freq: Two times a day (BID) | ORAL | 1 refills | Status: AC

## 2023-06-11 NOTE — Patient Instructions (Signed)

## 2023-06-13 ENCOUNTER — Encounter: Payer: Self-pay | Admitting: Obstetrics and Gynecology

## 2023-06-13 DIAGNOSIS — R7989 Other specified abnormal findings of blood chemistry: Secondary | ICD-10-CM

## 2023-06-13 HISTORY — DX: Other specified abnormal findings of blood chemistry: R79.89

## 2023-06-13 LAB — T4, FREE: Free T4: 0.9 ng/dL (ref 0.8–1.8)

## 2023-06-13 LAB — CBC
HCT: 36.7 % (ref 35.0–45.0)
Hemoglobin: 12 g/dL (ref 11.7–15.5)
MCH: 30 pg (ref 27.0–33.0)
MCHC: 32.7 g/dL (ref 32.0–36.0)
MCV: 91.8 fL (ref 80.0–100.0)
MPV: 13.2 fL — ABNORMAL HIGH (ref 7.5–12.5)
Platelets: 179 10*3/uL (ref 140–400)
RBC: 4 10*6/uL (ref 3.80–5.10)
RDW: 13.4 % (ref 11.0–15.0)
WBC: 5.2 10*3/uL (ref 3.8–10.8)

## 2023-06-13 LAB — RPR: RPR Ser Ql: NONREACTIVE

## 2023-06-13 LAB — HEMOGLOBIN A1C
Hgb A1c MFr Bld: 5.8 %{Hb} — ABNORMAL HIGH (ref <5.7)
Mean Plasma Glucose: 120 mg/dL
eAG (mmol/L): 6.6 mmol/L

## 2023-06-13 LAB — TEST AUTHORIZATION

## 2023-06-13 LAB — HIV ANTIBODY (ROUTINE TESTING W REFLEX): HIV 1&2 Ab, 4th Generation: NONREACTIVE

## 2023-06-13 LAB — FERRITIN: Ferritin: 7 ng/mL — ABNORMAL LOW (ref 16–154)

## 2023-06-13 LAB — HSV(HERPES SIMPLEX VRS) I + II AB-IGG
HSV 1 IGG,TYPE SPECIFIC AB: 19.7 {index} — ABNORMAL HIGH
HSV 2 IGG,TYPE SPECIFIC AB: <0.9 {index}

## 2023-06-13 LAB — VITAMIN B12: Vitamin B-12: 381 pg/mL (ref 200–1100)

## 2023-06-13 LAB — TSH: TSH: 5.7 m[IU]/L — ABNORMAL HIGH

## 2023-06-13 LAB — VITAMIN D 25 HYDROXY (VIT D DEFICIENCY, FRACTURES): Vit D, 25-Hydroxy: 9 ng/mL — ABNORMAL LOW (ref 30–100)

## 2023-06-13 LAB — IRON: Iron: 77 ug/dL (ref 40–190)

## 2023-06-13 LAB — HEPATITIS C ANTIBODY: Hepatitis C Ab: NONREACTIVE

## 2023-06-14 ENCOUNTER — Other Ambulatory Visit: Payer: Medicaid Other

## 2023-06-14 ENCOUNTER — Other Ambulatory Visit: Payer: Medicaid Other | Admitting: Obstetrics and Gynecology

## 2023-06-14 ENCOUNTER — Encounter: Payer: Self-pay | Admitting: Obstetrics and Gynecology

## 2023-06-14 ENCOUNTER — Other Ambulatory Visit: Payer: Self-pay | Admitting: Obstetrics and Gynecology

## 2023-06-14 DIAGNOSIS — R7989 Other specified abnormal findings of blood chemistry: Secondary | ICD-10-CM

## 2023-06-14 DIAGNOSIS — R790 Abnormal level of blood mineral: Secondary | ICD-10-CM

## 2023-06-14 LAB — CYTOLOGY - PAP
Comment: NEGATIVE
Diagnosis: NEGATIVE
High risk HPV: NEGATIVE

## 2023-06-14 MED ORDER — VITAMIN D (ERGOCALCIFEROL) 1.25 MG (50000 UNIT) PO CAPS: 50000.0000 [IU] | ORAL_CAPSULE | ORAL | 0 refills | Status: AC

## 2023-07-06 ENCOUNTER — Encounter: Payer: Self-pay | Admitting: Internal Medicine

## 2023-07-06 ENCOUNTER — Ambulatory Visit
Admission: EM | Admit: 2023-07-06 | Discharge: 2023-07-06 | Disposition: A | Discharge status: Home / Self Care | Payer: 59 | Attending: Internal Medicine | Admitting: Internal Medicine

## 2023-07-06 DIAGNOSIS — J029 Acute pharyngitis, unspecified: Secondary | ICD-10-CM

## 2023-07-06 DIAGNOSIS — R0981 Nasal congestion: Secondary | ICD-10-CM | POA: Diagnosis not present

## 2023-07-06 LAB — POC COVID19/FLU A&B COMBO
Covid Antigen, POC: NEGATIVE
Influenza A Antigen, POC: NEGATIVE
Influenza B Antigen, POC: NEGATIVE

## 2023-07-06 LAB — POCT RAPID STREP A (OFFICE): Rapid Strep A Screen: NEGATIVE

## 2023-07-06 MED ORDER — METHYLPREDNISOLONE 4 MG PO TBPK: ORAL_TABLET | ORAL | 0 refills | Status: DC

## 2023-07-06 NOTE — ED Provider Notes (Signed)
[facility]  Note:  This document was prepared using Dragon voice recognition software and may include unintentional dictation errors.  MRN: [id number] DOB: 23-Dec-1990 DATE: 07/06/23   Subjective:  Chief Complaint:  Chief Complaint  Patient presents with   Sore Throat   Fatigue   Nasal Congestion   Headache     HPI: Shelby Jackson is a 33 y.o. female presenting for sore throat and congestion for the past 4 days. Reports increased fatigue and nasal congestion. No known sick contacts, but reports that she has not started wearing her mask again until recently. She has not taken anything for her symptoms. Denies fever, vomiting, abdominal pain, otalgia, cough. Endorses sore throat, congestion, headache. Presents NAD.  Prior to Admission medications   Medication Sig Start Date End Date Taking? Authorizing Provider  azelastine (ASTELIN) 0.1 % nasal spray Place 1-2 sprays into both nostrils 2 (two) times daily as needed (itchy/watery eyes). Use in each nostril as directed 11/01/20   Ellamae Sia, DO  beclomethasone (QVAR) 80 MCG/ACT inhaler Inhale 1 puff into the lungs 2 (two) times daily. 09/08/20   Ardith Dark, MD  cetirizine (ZYRTEC) 10 MG tablet Take 10 mg by mouth daily as needed for allergies.    [provider]  HYDROcodone-acetaminophen (NORCO/VICODIN) 5-325 MG tablet Take 1 tablet by mouth every 6 (six) hours as needed. 02/13/23   [provider]  medroxyPROGESTERone (PROVERA) 10 MG tablet Take 1 tablet (10 mg total) by mouth daily. Take for 10 days. 08/03/21   Patton Salles, MD  Multiple Vitamin (MULTIVITAMIN WITH MINERALS) TABS tablet Take 1 tablet by mouth daily.    [provider]  naproxen sodium (ANAPROX DS) 550 MG tablet Take 1 tablet (550 mg total) by mouth 2 (two) times daily with a meal. Take twice a day as needed. 06/11/23   Patton Salles, MD  pantoprazole (PROTONIX) 40 MG tablet Take 1 tablet (40 mg total) by  mouth daily as needed. 04/01/19   Ardith Dark, MD  polyethylene glycol (MIRALAX) 17 g packet Use 17 gm dose three times daily, with a stool softener, until bowels clear, maximum of 3 consecutive days. 01/25/19   Elpidio Anis, PA-C  Vitamin D, Ergocalciferol, (DRISDOL) 1.25 MG (50000 UNIT) CAPS capsule Take 1 capsule (50,000 Units total) by mouth every 7 (seven) days. 06/14/23   Patton Salles, MD     Allergies  Allergen Reactions   Albuterol Anaphylaxis    Anaphylaxis.  Anaphylaxis.  Anaphylaxis.    Pineapple    Bactrim [Sulfamethoxazole-Trimethoprim] Other (See Comments)    Body aches   Caffeine Other (See Comments)    Veins popped out of head Veins popped out of head Other reaction(s): Tremor (intolerance) Veins popped out of head   Other Other (See Comments)    Body aches.   Sulfa Antibiotics Other (See Comments)    Body aches.   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 No pertinent surgical history.  Family History  Problem Relation Age of Onset   Anxiety disorder Father    Diabetes Father    Heart disease Father    Arthritis Father    Hypothyroidism Paternal Aunt    Heart disease Maternal Grandfather    Hypothyroidism Sister     Social History   Tobacco Use   Smoking status: Never   Smokeless tobacco: Never  Vaping Use   Vaping status: Never Used  Substance Use Topics   Alcohol use: No   Drug use: No    Review of Systems  Constitutional:  Positive for fatigue.  Negative for fever.  HENT:  Positive for congestion, rhinorrhea and sore throat. Negative for ear pain.   Respiratory:  Negative for cough.   Gastrointestinal:  Negative for abdominal pain, nausea and vomiting.  Neurological:  Positive for headaches.     Objective:   Vitals: BP 129/82 (BP Location: Right Arm)   Pulse 85   Temp 99.1 F (37.3 C) (Oral)   Resp 18   LMP 06/14/2023 (Exact Date)   SpO2 97%   Physical Exam Constitutional:      General: She is not in acute distress.    Appearance: Normal appearance. She is well-developed. She is obese. She is not ill-appearing or toxic-appearing.  HENT:     Head: Normocephalic and atraumatic.     Right Ear: Ear canal normal. A middle ear effusion is present.     Left Ear: Ear canal normal. A middle ear effusion is present.     Nose: Rhinorrhea present. Rhinorrhea is clear.     Mouth/Throat:     Pharynx: Oropharynx is clear. Uvula midline. No pharyngeal swelling, oropharyngeal exudate or posterior oropharyngeal erythema.     Tonsils: No tonsillar exudate or tonsillar abscesses.  Cardiovascular:     Rate and Rhythm: Normal rate and regular rhythm.     Heart sounds: Normal heart sounds.  Pulmonary:     Effort: Pulmonary effort is normal.     Breath sounds: Normal breath sounds.     Comments: Clear to auscultation bilaterally  Abdominal:     General: Bowel sounds are normal.     Palpations: Abdomen is soft.     Tenderness: There is no abdominal tenderness.  Skin:    General: Skin is warm and dry.  Neurological:     General: No focal deficit present.     Mental Status: She is alert.  Psychiatric:        Mood and Affect: Mood and affect normal.     Results:  Labs: Results for orders placed or performed during the hospital encounter of 07/06/23 (from the past 24 hours)  POCT rapid strep A     Status: None   Collection Time: 07/06/23  7:56 PM  Result Value Ref Range   Rapid Strep A Screen Negative Negative     Radiology: No results found.   UC Course/Treatments:  Procedures: Procedures   Medications Ordered in UC: Medications - No data to display   Assessment and Plan :     ICD-10-CM   1. Acute pharyngitis, unspecified etiology  J02.9     2. Nasal congestion  R09.81      Acute pharyngitis, unspecified etiology Afebrile, nontoxic-appearing, NAD. VSS. DDX includes but not limited to: strep, mono, viral pharyngitis, post nasal drip Your  strep test was negative today in office. Suspect viral etiology. Recommend salt water gargles and over-the-counter throat spray/lozenges. Strict ED precautions were given and patient verbalized understanding.  Nasal congestion Afebrile, nontoxic-appearing, NAD. VSS. DDX includes but not limited to: COVID, flu, viral URI, sinusitis, allergic rhinitis COVID and flu were negative today in office. Suspect viral etiology. Medrol DosePak as directed was prescribed for sinusitis. Strict ED precautions were given and patient verbalized understanding.   ED Discharge Orders          Ordered    methylPREDNISolone (MEDROL DOSEPAK) 4 MG TBPK tablet        07/06/23 2006             I have reviewed the PDMP during this encounter.     Cynda Acres, PA-C 07/06/23 2008

## 2023-07-06 NOTE — ED Triage Notes (Addendum)
Pt c/o sore throat, fatigue, SHOB, congestion, headache and fatigue x 4 days.   Home interventions: none

## 2023-07-06 NOTE — Discharge Instructions (Addendum)
Your flu, COVID, and strep tests were negative. Your symptoms appear consistent with a viral respiratory illness. Recommend symptomatic treatment with Tylenol/Ibuprofen as needed for fevers/aches. A Medrol DosePak was prescribed for congestion. Return in 3-4 days if no improvement. If new or worsening symptoms such as presistent fevers, difficulty breathing , shortness of breath, or chest pain, go directly to the ER.

## 2023-07-26 NOTE — Progress Notes (Signed)
 GYNECOLOGY  VISIT   HPI: 33 y.o.   Single  African American female   G0P0000 with Patient's last menstrual period was 07/09/2023.   here for: U/S consult- pt is three days late for cycle.  Patient has heavy menstrual bleeding and an enlarged uterus 16 week size with a palpable probable fibroid near her cervix when she was examined in office 06/11/23.   She has known fibroids up to 9 cm and a left ovarian cyst 4.4 x 3.66 x 4.5 on pelvic US at [facility] 01/26/23.   Interested in future pregnancy.   Not taking iron or vit D.   UPT today:  negative.   GYNECOLOGIC HISTORY: Patient's last menstrual period was 07/09/2023. Contraception:  condoms Menopausal hormone therapy:  n/a Last 2 paps:  06/11/23 neg: HR HPV neg, 03/07/18 neg: HR HPV neg History of abnormal Pap or positive HPV:  no Mammogram:  n/a        OB History     Gravida  0   Para  0   Term  0   Preterm  0   AB  0   Living  0      SAB  0   IAB  0   Ectopic  0   Multiple  0   Live Births  0              Patient Active Problem List   Diagnosis Date Noted   Other allergic rhinitis 11/01/2020   Allergic conjunctivitis of both eyes 11/01/2020   Asthma 11/01/2020   Other adverse food reactions, not elsewhere classified, subsequent encounter 11/01/2020   Adverse effect of other drugs, medicaments and biological substances, subsequent encounter 11/01/2020   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/06/2020   GAD (generalized anxiety disorder) 10/06/2020   OSA (obstructive sleep apnea) 09/08/2020   Seasonal allergies 03/02/2020   Gastroesophageal reflux disease without esophagitis 09/26/2018   Leg mass, bilateral 04/19/2018   Mild persistent asthma 04/19/2018   Eczema 04/19/2018   Elevated serum creatinine 04/19/2018    Past Medical History:  Diagnosis Date   Abnormal Pap smear of cervix 2009   Anxiety    Asthma    Bronchitis    Chronic superficial venous thrombosis of both lower  extremities    Depression    Eczema    Elevated hemoglobin A1c 2024   Elevated serum creatinine    Elevated TSH 2024   normal free T4   Fibroids    H/O chlamydia infection 07/2012   HSV-1 infection    genital   Low ferritin 2024   Low vitamin D level 2025   Recurrent upper respiratory infection (URI)     History reviewed. No pertinent surgical history.  Current Outpatient Medications  Medication Sig Dispense Refill   azelastine (ASTELIN) 0.1 % nasal spray Place 1-2 sprays into both nostrils 2 (two) times daily as needed (itchy/watery eyes). Use in each nostril as directed 30 mL 5   beclomethasone (QVAR) 80 MCG/ACT inhaler Inhale 1 puff into the lungs 2 (two) times daily. 1 each 12   cetirizine (ZYRTEC) 10 MG tablet Take 10 mg by mouth daily as needed for allergies.     HYDROcodone-acetaminophen (NORCO/VICODIN) 5-325 MG tablet Take 1 tablet by mouth every 6 (six) hours as needed.     Multiple Vitamin (MULTIVITAMIN WITH MINERALS) TABS tablet Take 1 tablet by mouth daily.     naproxen sodium (ANAPROX DS) 550 MG tablet Take 1 tablet (550 mg total)  by mouth 2 (two) times daily with a meal. Take twice a day as needed. 60 tablet 1   pantoprazole (PROTONIX) 40 MG tablet Take 1 tablet (40 mg total) by mouth daily as needed. 90 tablet 0   polyethylene glycol (MIRALAX) 17 g packet Use 17 gm dose three times daily, with a stool softener, until bowels clear, maximum of 3 consecutive days. 9 each 0   Vitamin D, Ergocalciferol, (DRISDOL) 1.25 MG (50000 UNIT) CAPS capsule Take 1 capsule (50,000 Units total) by mouth every 7 (seven) days. 12 capsule 0   No current facility-administered medications for this visit.     ALLERGIES: Albuterol, Pineapple, Bactrim [sulfamethoxazole-trimethoprim], Caffeine, Other, Sulfa antibiotics, and Latex  Family History  Problem Relation Age of Onset   Anxiety disorder Father    Diabetes Father    Heart disease Father    Arthritis Father    Hypothyroidism  Paternal Aunt    Heart disease Maternal Grandfather    Hypothyroidism Sister     Social History   Socioeconomic History   Marital status: Single    Spouse name: Not on file   Number of children: Not on file   Years of education: Not on file   Highest education level: Not on file  Occupational History   Not on file  Tobacco Use   Smoking status: Never   Smokeless tobacco: Never  Vaping Use   Vaping status: Never Used  Substance and Sexual Activity   Alcohol use: No   Drug use: No   Sexual activity: Yes    Partners: Male    Birth control/protection: Condom  Other Topics Concern   Not on file  Social History Narrative   Not on file   Social Drivers of Health   Financial Resource Strain: High Risk (02/28/2023)   Received from [facility]   Overall Financial Resource Strain (CARDIA)    Difficulty of Paying Living Expenses: Very hard  Food Insecurity: Medium Risk (04/06/2023)   Received from [facility]   Hunger Vital Sign    Worried About Running Out of Food in the Last Year: Sometimes true    Ran Out of Food in the Last Year: Sometimes true  Transportation Needs: No Transportation Needs (04/06/2023)   Received from [facility]    In the past 12 months, has lack of reliable transportation kept you from medical appointments, meetings, work or from getting things needed for daily living? : No  Physical Activity: Insufficiently Active (02/28/2023)   Received from [facility]   Exercise Vital Sign    Days of Exercise per Week: 3 days    Minutes of Exercise per Session: 30 min  Stress: Stress Concern Present (02/28/2023)   Received from [facility] of Occupational Health - Occupational Stress Questionnaire    Feeling of Stress : To some extent  Social Connections: Socially Integrated (02/28/2023)   Received from [facility]   Social Network    How would you rate your social network (family, work, friends)?: Good  participation with social networks  Intimate Partner Violence: Not At Risk (02/28/2023)   Received from [facility]   HITS    Over the last 12 months how often did your partner physically hurt you?: Never    Over the last 12 months how often did your partner insult you or talk down to you?: Never    Over the last 12 months how often did your partner threaten you with physical  harm?: Never    Over the last 12 months how often did your partner scream or curse at you?: Never    Review of Systems  All other systems reviewed and are negative.   PHYSICAL EXAMINATION:   BP 126/82 (BP Location: Left Arm, Patient Position: Sitting, Cuff Size: Large)   Pulse 84   Wt 220 lb (99.8 kg)   LMP 07/09/2023   SpO2 96%   BMI 40.90 kg/m     General appearance: alert, cooperative and appears stated age  Pelvic US Uterus 9.77 x 8.63 x. 10.78 cm.  3 fibroids - intramural and submucosal:  6.62 cm, 4.52 cm, 1.00 cm (submucosal) EMS 18 mm.  11 mm submucosal fibroid.  Left ovary 3.47 x 2.78 x 2.44 cm.  Follicles noted.  Right ovary 3.65 x 2.32 x 1.96 cm.  Follicles noted. No adnexal masses.  No free fluid.   ASSESSMENT:  Fibroids:  subserosal and intramural.  Desire for future pregnancy.  Elevated TSH.  PLAN:  Pelvic US images and report reviewed.  Recheck TSH, free T4.  Referral to Dr. April Manson for potential robotic myomectomy.  Fu in 2 months for recheck of Vit D and iron.    33 min  total time was spent for this patient encounter, including preparation, face-to-face counseling with the patient, coordination of care, and documentation of the encounter.

## 2023-08-02 ENCOUNTER — Other Ambulatory Visit: Payer: 59

## 2023-08-02 ENCOUNTER — Other Ambulatory Visit: Payer: Medicaid Other | Admitting: Obstetrics and Gynecology

## 2023-08-09 ENCOUNTER — Ambulatory Visit (INDEPENDENT_AMBULATORY_CARE_PROVIDER_SITE_OTHER): Payer: 59

## 2023-08-09 ENCOUNTER — Ambulatory Visit (INDEPENDENT_AMBULATORY_CARE_PROVIDER_SITE_OTHER): Payer: 59 | Admitting: Obstetrics and Gynecology

## 2023-08-09 ENCOUNTER — Encounter: Payer: Self-pay | Admitting: Obstetrics and Gynecology

## 2023-08-09 VITALS — BP 126/82 | HR 84 | Wt 220.0 lb

## 2023-08-09 DIAGNOSIS — D219 Benign neoplasm of connective and other soft tissue, unspecified: Secondary | ICD-10-CM

## 2023-08-09 DIAGNOSIS — N926 Irregular menstruation, unspecified: Secondary | ICD-10-CM | POA: Diagnosis not present

## 2023-08-09 DIAGNOSIS — R7989 Other specified abnormal findings of blood chemistry: Secondary | ICD-10-CM

## 2023-08-09 LAB — TSH: TSH: 4.99 m[IU]/L — ABNORMAL HIGH

## 2023-08-09 LAB — T4, FREE: Free T4: 1.1 ng/dL (ref 0.8–1.8)

## 2023-08-09 LAB — PREGNANCY, URINE: Preg Test, Ur: NEGATIVE

## 2023-08-13 ENCOUNTER — Encounter: Payer: Self-pay | Admitting: Obstetrics and Gynecology

## 2023-09-18 ENCOUNTER — Encounter: Payer: Self-pay | Admitting: Obstetrics and Gynecology

## 2023-10-03 ENCOUNTER — Other Ambulatory Visit: Payer: Self-pay | Admitting: Obstetrics and Gynecology

## 2023-10-03 DIAGNOSIS — R7989 Other specified abnormal findings of blood chemistry: Secondary | ICD-10-CM

## 2023-10-03 DIAGNOSIS — R790 Abnormal level of blood mineral: Secondary | ICD-10-CM

## 2023-10-03 NOTE — Progress Notes (Signed)
 Orders for CBC, iron, ferritin, vit D.

## 2023-10-10 ENCOUNTER — Other Ambulatory Visit: Payer: 59

## 2023-11-26 ENCOUNTER — Other Ambulatory Visit
